# Patient Record
Sex: Female | Born: 1940
Health system: Southern US, Community
[De-identification: ages and names within clinical notes are randomized; demographics above are authoritative.]

## PROBLEM LIST (undated history)

## (undated) DIAGNOSIS — E7800 Pure hypercholesterolemia, unspecified: Secondary | ICD-10-CM

## (undated) DIAGNOSIS — M8580 Other specified disorders of bone density and structure, unspecified site: Secondary | ICD-10-CM

## (undated) DIAGNOSIS — I4891 Unspecified atrial fibrillation: Secondary | ICD-10-CM

## (undated) DIAGNOSIS — E669 Obesity, unspecified: Secondary | ICD-10-CM

## (undated) DIAGNOSIS — I10 Essential (primary) hypertension: Secondary | ICD-10-CM

## (undated) DIAGNOSIS — E119 Type 2 diabetes mellitus without complications: Secondary | ICD-10-CM

## (undated) DIAGNOSIS — I482 Chronic atrial fibrillation, unspecified: Secondary | ICD-10-CM

## (undated) HISTORY — DX: Essential (primary) hypertension: I10

## (undated) HISTORY — DX: Obesity, unspecified: E66.9

## (undated) HISTORY — PX: PARTIAL HYSTERECTOMY: SHX80

## (undated) HISTORY — DX: Other specified disorders of bone density and structure, unspecified site: M85.80

## (undated) HISTORY — DX: Type 2 diabetes mellitus without complications: E11.9

## (undated) HISTORY — DX: Chronic atrial fibrillation, unspecified: I48.20

---

## 1999-02-12 ENCOUNTER — Encounter: Admission: RE | Admit: 1999-02-12 | Discharge: 1999-02-12 | Payer: Self-pay | Admitting: Emergency Medicine

## 1999-02-12 ENCOUNTER — Encounter: Payer: Self-pay | Admitting: Emergency Medicine

## 1999-05-07 ENCOUNTER — Encounter: Payer: Self-pay | Admitting: Emergency Medicine

## 1999-05-07 ENCOUNTER — Encounter: Admission: RE | Admit: 1999-05-07 | Discharge: 1999-05-07 | Payer: Self-pay | Admitting: Emergency Medicine

## 2000-02-24 ENCOUNTER — Encounter: Payer: Self-pay | Admitting: Emergency Medicine

## 2000-02-24 ENCOUNTER — Encounter: Admission: RE | Admit: 2000-02-24 | Discharge: 2000-02-24 | Payer: Self-pay | Admitting: Emergency Medicine

## 2001-12-05 ENCOUNTER — Encounter: Payer: Self-pay | Admitting: Emergency Medicine

## 2001-12-05 ENCOUNTER — Encounter: Admission: RE | Admit: 2001-12-05 | Discharge: 2001-12-05 | Payer: Self-pay | Admitting: Emergency Medicine

## 2003-01-10 ENCOUNTER — Encounter: Admission: RE | Admit: 2003-01-10 | Discharge: 2003-01-10 | Payer: Self-pay | Admitting: Emergency Medicine

## 2003-05-08 ENCOUNTER — Encounter: Admission: RE | Admit: 2003-05-08 | Discharge: 2003-05-08 | Payer: Self-pay | Admitting: Emergency Medicine

## 2004-01-22 ENCOUNTER — Encounter: Admission: RE | Admit: 2004-01-22 | Discharge: 2004-01-22 | Payer: Self-pay | Admitting: Emergency Medicine

## 2005-01-22 ENCOUNTER — Encounter: Admission: RE | Admit: 2005-01-22 | Discharge: 2005-01-22 | Payer: Self-pay | Admitting: Emergency Medicine

## 2005-02-23 ENCOUNTER — Ambulatory Visit: Payer: Self-pay | Admitting: Internal Medicine

## 2005-02-25 ENCOUNTER — Ambulatory Visit: Payer: Self-pay | Admitting: Internal Medicine

## 2006-01-24 ENCOUNTER — Encounter: Admission: RE | Admit: 2006-01-24 | Discharge: 2006-01-24 | Payer: Self-pay | Admitting: Emergency Medicine

## 2007-01-27 ENCOUNTER — Encounter: Admission: RE | Admit: 2007-01-27 | Discharge: 2007-01-27 | Payer: Self-pay | Admitting: Emergency Medicine

## 2007-06-20 ENCOUNTER — Encounter: Admission: RE | Admit: 2007-06-20 | Discharge: 2007-06-20 | Payer: Self-pay | Admitting: Emergency Medicine

## 2008-01-29 ENCOUNTER — Encounter: Admission: RE | Admit: 2008-01-29 | Discharge: 2008-01-29 | Payer: Self-pay | Admitting: Emergency Medicine

## 2008-04-06 ENCOUNTER — Emergency Department (HOSPITAL_COMMUNITY): Admission: EM | Admit: 2008-04-06 | Discharge: 2008-04-06 | Payer: Self-pay | Admitting: Emergency Medicine

## 2008-11-01 ENCOUNTER — Encounter: Payer: Self-pay | Admitting: Internal Medicine

## 2008-12-27 ENCOUNTER — Encounter (INDEPENDENT_AMBULATORY_CARE_PROVIDER_SITE_OTHER): Payer: Self-pay | Admitting: *Deleted

## 2009-01-22 ENCOUNTER — Encounter: Payer: Self-pay | Admitting: Internal Medicine

## 2009-01-29 ENCOUNTER — Encounter: Admission: RE | Admit: 2009-01-29 | Discharge: 2009-01-29 | Payer: Self-pay | Admitting: Internal Medicine

## 2009-02-03 ENCOUNTER — Ambulatory Visit: Payer: Self-pay | Admitting: Internal Medicine

## 2009-02-03 ENCOUNTER — Encounter (INDEPENDENT_AMBULATORY_CARE_PROVIDER_SITE_OTHER): Payer: Self-pay | Admitting: *Deleted

## 2009-02-03 DIAGNOSIS — R1319 Other dysphagia: Secondary | ICD-10-CM

## 2009-02-04 ENCOUNTER — Ambulatory Visit: Payer: Self-pay | Admitting: Internal Medicine

## 2009-02-16 ENCOUNTER — Emergency Department (HOSPITAL_COMMUNITY): Admission: EM | Admit: 2009-02-16 | Discharge: 2009-02-16 | Payer: Self-pay | Admitting: Family Medicine

## 2010-02-05 ENCOUNTER — Encounter
Admission: RE | Admit: 2010-02-05 | Discharge: 2010-02-05 | Payer: Self-pay | Source: Home / Self Care | Attending: Internal Medicine | Admitting: Internal Medicine

## 2010-03-22 ENCOUNTER — Encounter: Payer: Self-pay | Admitting: Emergency Medicine

## 2010-07-03 ENCOUNTER — Other Ambulatory Visit: Payer: Self-pay | Admitting: Internal Medicine

## 2010-07-03 DIAGNOSIS — Z1231 Encounter for screening mammogram for malignant neoplasm of breast: Secondary | ICD-10-CM

## 2010-10-16 IMAGING — CR DG CHEST 2V
2 series · 2 of 2 positions shown · non-contrast
Comparison: 05/08/2003

CLINICAL DATA: Cough

CHEST - 2 VIEW

[view not recorded (1 of 2)]
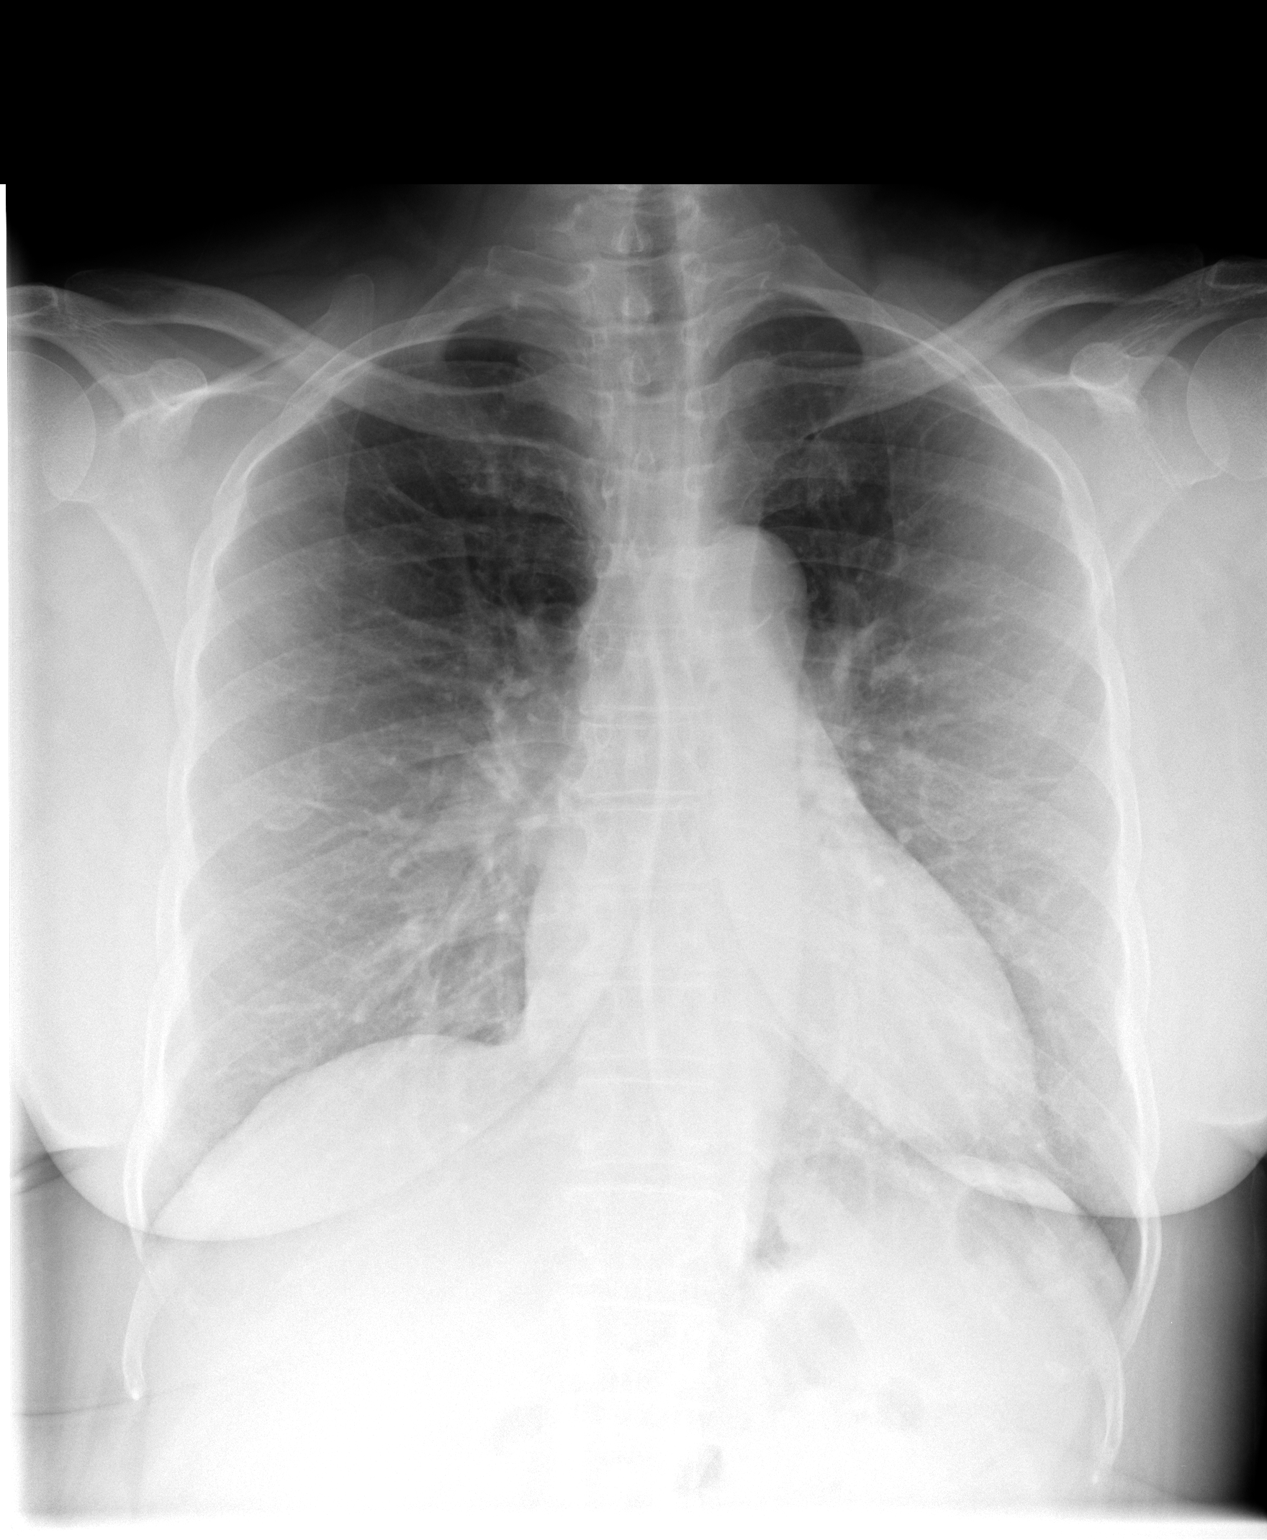

[view not recorded (2 of 2)]
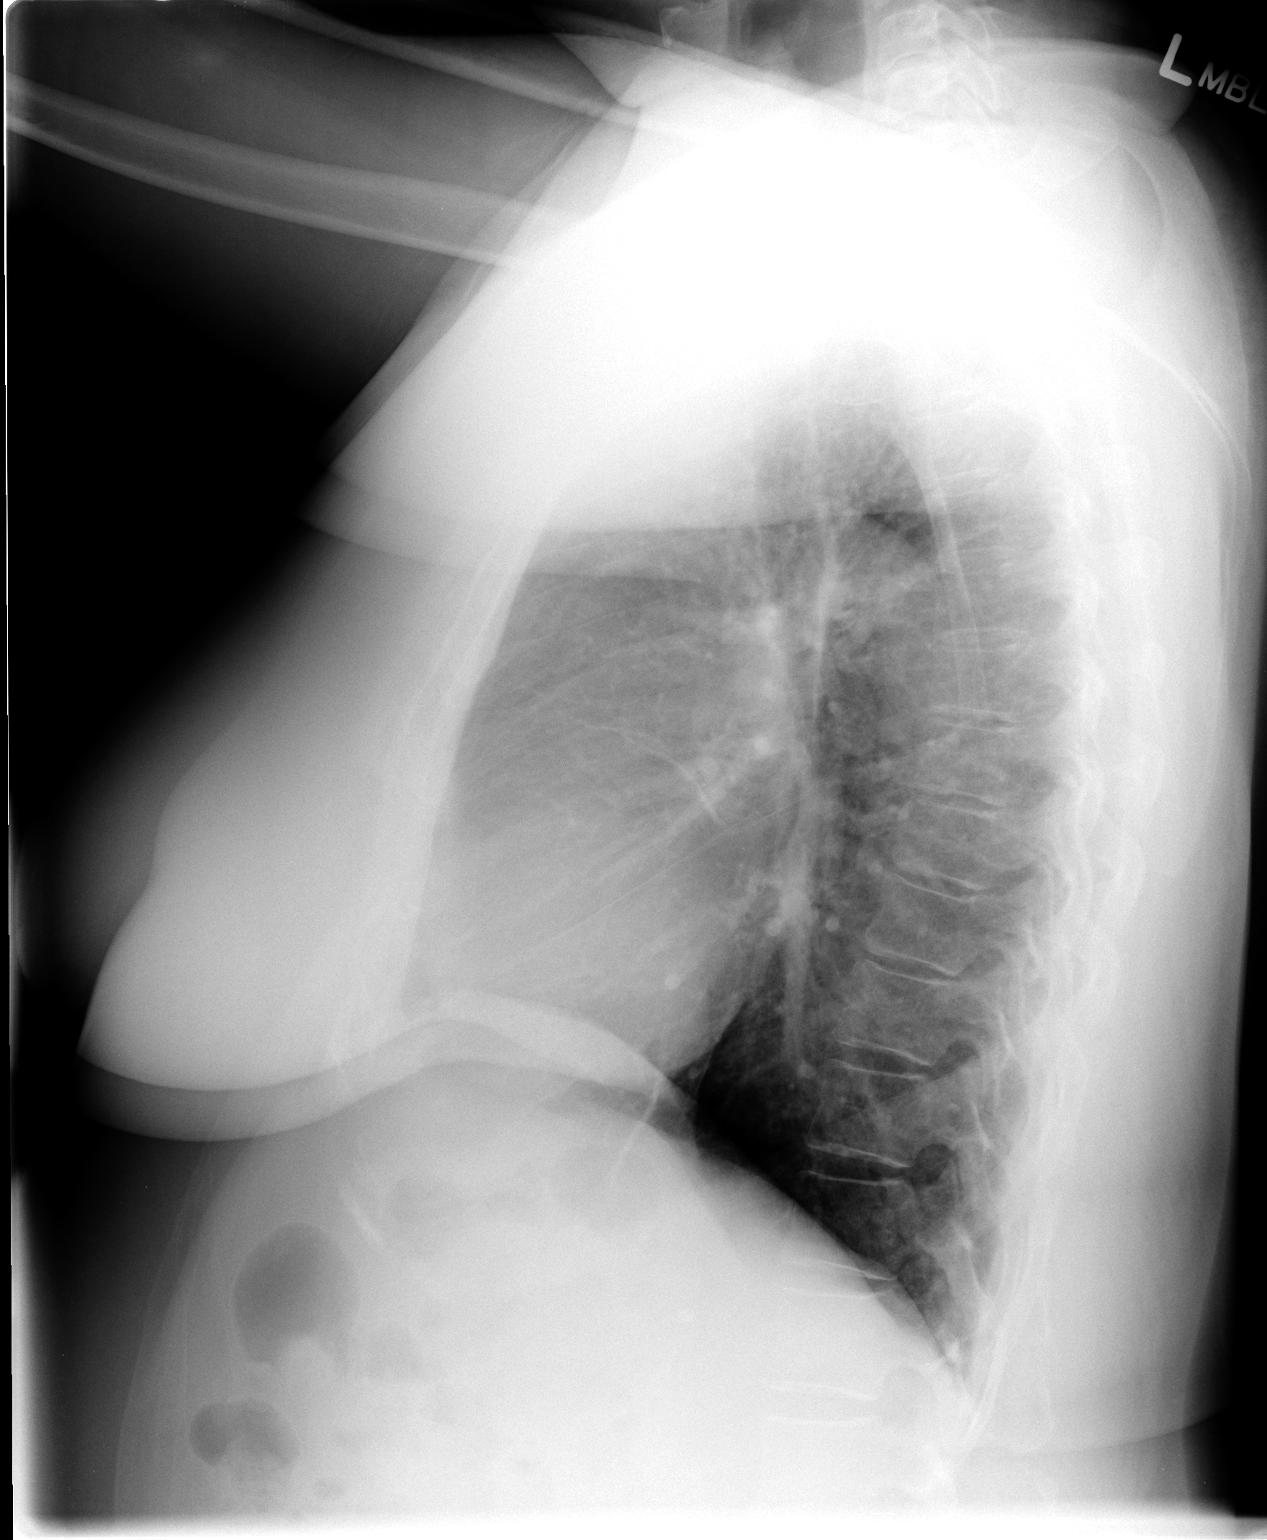

[2 of 2 positions shown; findings below may reference images not displayed]

FINDINGS: Mildly prominent diffuse interstitial markings as before
without confluent airspace infiltrate or overt edema.  No effusion.
Heart size upper limits normal.  Minimal atheromatous change noted
in the aortic arch.  Visualized bones unremarkable.
IMPRESSION: Mild chronic interstitial changes without acute or superimposed
abnormality.

## 2011-02-08 ENCOUNTER — Ambulatory Visit: Payer: Self-pay

## 2011-02-08 ENCOUNTER — Ambulatory Visit
Admission: RE | Admit: 2011-02-08 | Discharge: 2011-02-08 | Disposition: A | Discharge status: Home / Self Care | Payer: BC Managed Care – PPO | Source: Ambulatory Visit | Attending: Sports Medicine | Admitting: Sports Medicine

## 2011-02-08 DIAGNOSIS — Z1231 Encounter for screening mammogram for malignant neoplasm of breast: Secondary | ICD-10-CM

## 2011-09-06 ENCOUNTER — Encounter (HOSPITAL_COMMUNITY): Payer: Self-pay | Admitting: Emergency Medicine

## 2011-09-06 ENCOUNTER — Emergency Department (HOSPITAL_COMMUNITY)
Admission: EM | Admit: 2011-09-06 | Discharge: 2011-09-06 | Disposition: A | Discharge status: Home / Self Care | Payer: BC Managed Care – PPO | Attending: Emergency Medicine | Admitting: Emergency Medicine

## 2011-09-06 ENCOUNTER — Emergency Department (HOSPITAL_COMMUNITY): Payer: BC Managed Care – PPO

## 2011-09-06 DIAGNOSIS — S99919A Unspecified injury of unspecified ankle, initial encounter: Secondary | ICD-10-CM | POA: Insufficient documentation

## 2011-09-06 DIAGNOSIS — E7800 Pure hypercholesterolemia, unspecified: Secondary | ICD-10-CM | POA: Insufficient documentation

## 2011-09-06 DIAGNOSIS — I10 Essential (primary) hypertension: Secondary | ICD-10-CM | POA: Insufficient documentation

## 2011-09-06 DIAGNOSIS — W19XXXA Unspecified fall, initial encounter: Secondary | ICD-10-CM | POA: Insufficient documentation

## 2011-09-06 DIAGNOSIS — S8000XA Contusion of unspecified knee, initial encounter: Secondary | ICD-10-CM

## 2011-09-06 DIAGNOSIS — Y9289 Other specified places as the place of occurrence of the external cause: Secondary | ICD-10-CM | POA: Insufficient documentation

## 2011-09-06 DIAGNOSIS — S8990XA Unspecified injury of unspecified lower leg, initial encounter: Secondary | ICD-10-CM | POA: Insufficient documentation

## 2011-09-06 HISTORY — DX: Pure hypercholesterolemia, unspecified: E78.00

## 2011-09-06 NOTE — ED Notes (Signed)
Pt.stated, i forgot to step down in the parking lot and hit my lt. knee

## 2011-09-06 NOTE — Progress Notes (Signed)
Orthopedic Tech Progress Note Patient Details:  Jessica Baxter April 23, 1940 [id number]  Ortho Devices Type of Ortho Device: Knee Sleeve Ortho Device/Splint Interventions: Application   Jessica Baxter, Jessica Baxter 09/06/2011, 12:16 PM

## 2011-09-06 NOTE — ED Provider Notes (Signed)
History    This chart was scribed for Toy Baker, MD, MD by Smitty Pluck. The patient was seen in room TR07C and the patient's care was started at 11:23AM.   CSN: [id number]  Arrival date & time 09/06/11  0941   First MD Initiated Contact with Patient 09/06/11 1121      Chief Complaint  Patient presents with  . Knee Injury    (Consider location/radiation/quality/duration/timing/severity/associated sxs/prior treatment) The history is provided by the patient.   Jessica Baxter is a 71 y.o. female who presents to the Emergency Department complaining of fall onset today in parking lot causing moderate left knee pain. Pt denies taking blood thinners. Pt is ambulatory. Pt has mild right elbow pain. Denies LOC, head injury and neck pain. Denies shoulder pain and wrist pain. Symptoms have been constant since onset without radiation. Pain worse with walking  Past Medical History  Diagnosis Date  . Hypertension   . Hypercholesteremia     History reviewed. No pertinent past surgical history.  No family history on file.  History  Substance Use Topics  . Smoking status: Not on file  . Smokeless tobacco: Not on file  . Alcohol Use: No    OB History    Grav Para Term Preterm Abortions TAB SAB Ect Mult Living                  Review of Systems  Constitutional: Negative for fever and chills.  Respiratory: Negative for shortness of breath.   Gastrointestinal: Negative for nausea and vomiting.  Neurological: Negative for weakness.    Allergies  Review of patient's allergies indicates no known allergies.  Home Medications   Current Outpatient Rx  Name Route Sig Dispense Refill  . ASPIRIN EC 81 MG PO TBEC Oral Take 81 mg by mouth daily.    Marland Kitchen GARLIC PO TABS Oral Take 1 tablet by mouth daily.    Marland Kitchen GLUCOSAMINE PO Oral Take 1 tablet by mouth daily.    Marland Kitchen HYDROCHLOROTHIAZIDE 25 MG PO TABS Oral Take 25 mg by mouth daily.    Marland Kitchen LOSARTAN POTASSIUM 100 MG PO TABS Oral Take 100 mg by  mouth daily.    . RED YEAST RICE PO Oral Take 1 tablet by mouth daily.    Marland Kitchen VITAMIN D (CHOLECALCIFEROL) PO Oral Take 1 tablet by mouth daily.      BP 164/82  Pulse 57  Temp 98.3 F (36.8 C) (Oral)  Resp 18  SpO2 100%  Physical Exam  Nursing note and vitals reviewed. Constitutional: She is oriented to person, place, and time. She appears well-developed and well-nourished. No distress.  HENT:  Head: Normocephalic and atraumatic.  Eyes: EOM are normal. Pupils are equal, round, and reactive to light.  Neck: Normal range of motion. Neck supple. No tracheal deviation present.  Cardiovascular: Normal rate.   Pulmonary/Chest: Effort normal. No respiratory distress.  Abdominal: Soft. She exhibits no distension.  Musculoskeletal: Normal range of motion.       Right elbow rom nl no efusioin  Left knee has no patellar efusion but does have an  abrasion noted to patellar Right hand abrasions noted--no active bleeding  Neurological: She is alert and oriented to person, place, and time.  Skin: Skin is warm and dry.  Psychiatric: She has a normal mood and affect. Her behavior is normal.    ED Course  Procedures (including critical care time) DIAGNOSTIC STUDIES: Oxygen Saturation is 100% on room air, normal by my interpretation.  COORDINATION OF CARE:    Labs Reviewed - No data to display Dg Knee Complete 4 Views Left  09/06/2011  *RADIOLOGY REPORT*  Clinical Data: Fall.  Anterior pain and swelling.  LEFT KNEE - COMPLETE 4+ VIEW  Comparison: None.  Findings: Osteoarthritis noted with tri-compartmental spurring and medial compartmental narrowing.  No fracture observed.  Abnormal subcutaneous edema anterior to the patella and patellar tendon may reflect bruising.  No definite knee effusion, although sensitivity is reduced due to the degree of flexion during imaging.  IMPRESSION:  1.  Subcutaneous edema anterior to the patella and patellar tendon, probably representing bruising. 2.   Osteoarthritis.  Original Report Authenticated By: Dellia Cloud, M.D.     No diagnosis found.    MDM   Pt place in knee splint and will f/u her pcp as needed  I personally performed the services described in this documentation, which was scribed in my presence. The recorded information has been reviewed and considered.     Toy Baker, MD 09/06/11 1213

## 2011-09-06 NOTE — ED Notes (Signed)
Was bringing great niece to er and was walking thru parking lot and  Did not see step up and tripped and fell flat hurting left knee a, which now has swelling and scraped  Rt elbo

## 2011-12-31 ENCOUNTER — Other Ambulatory Visit: Payer: Self-pay | Admitting: Internal Medicine

## 2011-12-31 DIAGNOSIS — Z1231 Encounter for screening mammogram for malignant neoplasm of breast: Secondary | ICD-10-CM

## 2012-02-09 ENCOUNTER — Ambulatory Visit
Admission: RE | Admit: 2012-02-09 | Discharge: 2012-02-09 | Disposition: A | Discharge status: Home / Self Care | Payer: BC Managed Care – PPO | Source: Ambulatory Visit | Attending: Internal Medicine | Admitting: Internal Medicine

## 2012-02-09 DIAGNOSIS — Z1231 Encounter for screening mammogram for malignant neoplasm of breast: Secondary | ICD-10-CM

## 2013-01-11 ENCOUNTER — Other Ambulatory Visit: Payer: Self-pay

## 2013-01-11 DIAGNOSIS — Z1231 Encounter for screening mammogram for malignant neoplasm of breast: Secondary | ICD-10-CM

## 2013-02-09 ENCOUNTER — Ambulatory Visit
Admission: RE | Admit: 2013-02-09 | Discharge: 2013-02-09 | Disposition: A | Discharge status: Home / Self Care | Payer: BC Managed Care – PPO | Source: Ambulatory Visit

## 2013-02-09 DIAGNOSIS — Z1231 Encounter for screening mammogram for malignant neoplasm of breast: Secondary | ICD-10-CM

## 2013-04-25 ENCOUNTER — Encounter: Payer: Self-pay | Admitting: Cardiology

## 2013-04-25 ENCOUNTER — Ambulatory Visit (HOSPITAL_COMMUNITY): Payer: BC Managed Care – PPO | Attending: Cardiology | Admitting: Radiology

## 2013-04-25 DIAGNOSIS — I4891 Unspecified atrial fibrillation: Secondary | ICD-10-CM

## 2013-04-25 DIAGNOSIS — I059 Rheumatic mitral valve disease, unspecified: Secondary | ICD-10-CM | POA: Insufficient documentation

## 2013-04-25 DIAGNOSIS — R0602 Shortness of breath: Secondary | ICD-10-CM

## 2013-04-25 DIAGNOSIS — I10 Essential (primary) hypertension: Secondary | ICD-10-CM | POA: Insufficient documentation

## 2013-04-25 DIAGNOSIS — E119 Type 2 diabetes mellitus without complications: Secondary | ICD-10-CM | POA: Insufficient documentation

## 2013-04-25 NOTE — Addendum Note (Signed)
Addended by: Carmel SacramentoMCFATTER, Roger Fasnacht W on: 04/25/2013 12:58 PM   Modules accepted: Orders

## 2013-04-25 NOTE — Progress Notes (Signed)
Echocardiogram Performed. 

## 2013-05-16 ENCOUNTER — Encounter: Payer: Self-pay | Admitting: *Deleted

## 2013-05-16 ENCOUNTER — Ambulatory Visit (INDEPENDENT_AMBULATORY_CARE_PROVIDER_SITE_OTHER): Payer: BC Managed Care – PPO | Admitting: Cardiovascular Disease

## 2013-05-16 ENCOUNTER — Other Ambulatory Visit: Payer: Self-pay | Admitting: *Deleted

## 2013-05-16 ENCOUNTER — Encounter: Payer: Self-pay | Admitting: Cardiovascular Disease

## 2013-05-16 VITALS — BP 186/82 | HR 90 | Ht 62.0 in | Wt 193.8 lb

## 2013-05-16 DIAGNOSIS — I482 Chronic atrial fibrillation, unspecified: Secondary | ICD-10-CM | POA: Insufficient documentation

## 2013-05-16 DIAGNOSIS — R7301 Impaired fasting glucose: Secondary | ICD-10-CM | POA: Insufficient documentation

## 2013-05-16 DIAGNOSIS — I4891 Unspecified atrial fibrillation: Secondary | ICD-10-CM

## 2013-05-16 DIAGNOSIS — E663 Overweight: Secondary | ICD-10-CM | POA: Insufficient documentation

## 2013-05-16 DIAGNOSIS — E7800 Pure hypercholesterolemia, unspecified: Secondary | ICD-10-CM

## 2013-05-16 DIAGNOSIS — M8580 Other specified disorders of bone density and structure, unspecified site: Secondary | ICD-10-CM | POA: Insufficient documentation

## 2013-05-16 DIAGNOSIS — I10 Essential (primary) hypertension: Secondary | ICD-10-CM

## 2013-05-16 MED ORDER — APIXABAN 5 MG PO TABS: 5.0000 mg | ORAL_TABLET | Freq: Two times a day (BID) | ORAL | Status: DC

## 2013-05-16 NOTE — Assessment & Plan Note (Signed)
She prefers conservative approach.  [facility].  Start Eliquis  5 bid f/u clinic x 1   She prefers this to coumadin  Discussed risks of stroke vs bleeding

## 2013-05-16 NOTE — Assessment & Plan Note (Signed)
Well controlled.  Continue current medications and low sodium Dash type diet.   Avoid beta blockers 

## 2013-05-16 NOTE — Assessment & Plan Note (Signed)
Cholesterol is at goal.  Continue current dose of statin and diet Rx.  No myalgias or side effects.  F/U  LFT's in 6 months. No results found for this basename: [facility]  Labs with Dr Nehemiah SettlePolite  LFTls normal 2/15

## 2013-05-16 NOTE — Progress Notes (Signed)
Patient ID: Jessica Baxter, female   DOB: 02/23/1941, 73 y.o.   MRN: [id number]   73 yo referred by Dr Nehemiah SettlePolite for afib.  Relatively asymptomatic.  She has HTN, DM.  Meds adjusted recently.  Intolerant to beta blocker with fatigue and dyspnea.  Cr normal and no bleeding diathesis.  Not started on anticoagulation.  Still works on handicap bus.  CHADVASC Score is 4.  ( female age 74-73, DM and HTN)  She really denies SSCP, palpitations Occasional LE edema  Echo reviewed from 3/16 normal EF no valve disease and moderate LAE.  Long discussion with her regarding  Rate control , anticoagulation and cardioversion No history of TIA/CVA    04/13/13 Cr 1.03 K 3.7  PLT 271 Hct 35.6   ROS: Denies fever, malais, weight loss, blurry vision, decreased visual acuity, cough, sputum, SOB, hemoptysis, pleuritic pain, palpitaitons, heartburn, abdominal pain, melena, lower extremity edema, claudication, or rash.  All other systems reviewed and negative   General: Affect appropriate Healthy:  appears stated age HEENT: normal Neck supple with no adenopathy JVP normal no bruits no thyromegaly Lungs clear with no wheezing and good diaphragmatic motion Heart:  S1/S2 no murmur,rub, gallop or click PMI normal Abdomen: benighn, BS positve, no tenderness, no AAA no bruit.  No HSM or HJR Distal pulses intact with no bruits No edema Neuro non-focal Skin warm and dry No muscular weakness  Medications Current Outpatient Prescriptions  Medication Sig Dispense Refill  . ALPRAZolam (XANAX) 0.25 MG tablet Take 0.25 mg by mouth at bedtime as needed.       Marland Kitchen. amLODipine (NORVASC) 5 MG tablet Take 5 mg by mouth daily.       Marland Kitchen. aspirin EC 81 MG tablet Take 81 mg by mouth daily.      Marland Kitchen. CARDIZEM LA 120 MG 24 hr tablet Take 120 mg by mouth daily.       Marland Kitchen. COLCRYS 0.6 MG tablet Take 0.6 mg by mouth daily.       . furosemide (LASIX) 20 MG tablet Take 20 mg by mouth daily.       . Garlic TABS Take 1 tablet by mouth daily.      Marland Kitchen.  GLUCOSAMINE PO Take 1 tablet by mouth daily.      Marland Kitchen. KLOR-CON M10 10 MEQ tablet       . losartan (COZAAR) 100 MG tablet Take 100 mg by mouth daily.      . Red Yeast Rice Extract (RED YEAST RICE PO) Take 1 tablet by mouth daily.      . traMADol (ULTRAM) 50 MG tablet 1 tablet as needed for pain      . VITAMIN D, CHOLECALCIFEROL, PO Take 1 tablet by mouth daily.      Marland Kitchen. apixaban (ELIQUIS) 5 MG TABS tablet Take 1 tablet (5 mg total) by mouth 2 (two) times daily.  60 tablet  6   No current facility-administered medications for this visit.    Allergies Review of patient's allergies indicates no known allergies.  Family History: No family history on file.  Social History: History   Social History  . Marital Status: Single    Spouse Name: N/A    Number of Children: N/A  . Years of Education: N/A   Occupational History  . Not on file.   Social History Main Topics  . Smoking status: Never Smoker   . Smokeless tobacco: Not on file  . Alcohol Use: No  . Drug Use: No  . Sexual  Activity: Not on file   Other Topics Concern  . Not on file   Social History Narrative  . No narrative on file    Electrocardiogram:  Afib rate 82   04/27/13  Otherwise normal   Assessment and Plan

## 2013-05-16 NOTE — Patient Instructions (Signed)
Your physician recommends that you schedule a follow-up appointment in:    3 MONTHS WITH   DR Eden EmmsNISHAN AND  ALSO  COUMADIN CLINIC  NEXT AVAILABLE  INITIATING  ELIQUIS Your physician has recommended you make the following change in your medication:  START ELIQUIS  5 MG  TWICE  DAILY

## 2013-05-17 ENCOUNTER — Ambulatory Visit: Payer: BC Managed Care – PPO | Admitting: Pharmacist

## 2013-05-17 ENCOUNTER — Ambulatory Visit (INDEPENDENT_AMBULATORY_CARE_PROVIDER_SITE_OTHER): Payer: BC Managed Care – PPO | Admitting: Pharmacist

## 2013-05-17 DIAGNOSIS — I4891 Unspecified atrial fibrillation: Secondary | ICD-10-CM

## 2013-05-17 LAB — BASIC METABOLIC PANEL
BUN: 14 mg/dL (ref 6–23)
CHLORIDE: 104 meq/L (ref 96–112)
CO2: 27 mEq/L (ref 19–32)
Calcium: 9 mg/dL (ref 8.4–10.5)
Creatinine, Ser: 1 mg/dL (ref 0.4–1.2)
GFR: 66.67 mL/min (ref >60.00)
Glucose, Bld: 83 mg/dL (ref 70–99)
Potassium: 3.5 mEq/L (ref 3.5–5.1)
Sodium: 140 mEq/L (ref 135–145)

## 2013-05-17 LAB — CBC
HCT: 34.4 % — ABNORMAL LOW (ref 36.0–46.0)
Hemoglobin: 11.3 g/dL — ABNORMAL LOW (ref 12.0–15.0)
MCHC: 32.7 g/dL (ref 30.0–36.0)
MCV: 87.4 fl (ref 78.0–100.0)
Platelets: 255 10*3/uL (ref 150.0–400.0)
RBC: 3.93 Mil/uL (ref 3.87–5.11)
RDW: 13.3 % (ref 11.5–14.6)
WBC: 8.5 10*3/uL (ref 4.5–10.5)

## 2013-05-17 NOTE — Progress Notes (Signed)
Pt was started on Eliquis for atrial fibrillation on May 16, 2013.    Reviewed patients medication list.  Pt is not currently on any combined P-gp and strong CYP3A4 inhibitors/inducers (ketoconazole, traconazole, ritonavir, carbamazepine, phenytoin, rifampin, St. John's wort).  Reviewed labs.  SCr 1.0 , Weight 87.9 Kg, Age 73 yrs old.  Dose appropriate based on Criteria. Hgb and HCT 11.3/34.4. Pt is [facility] pt. And H&H are normally in this range per Dr Jimmye NormanSmart, Pharm D.   A full discussion of the nature of anticoagulants has been carried out.  A benefit/risk analysis has been presented to the patient, so that they understand the justification for choosing anticoagulation with Eliquis at this time.  The need for compliance is stressed.  Pt is aware to take the medication twice daily.  Side effects of potential bleeding are discussed, including unusual colored urine or stools, coughing up blood or coffee ground emesis, nose bleeds or serious fall or head trauma.  Discussed signs and symptoms of stroke. The patient should avoid any OTC items containing aspirin or ibuprofen.  Avoid alcohol consumption.   Call if any signs of abnormal bleeding.  Discussed financial obligations and resolved any difficulty in obtaining medication.  Next lab test test in 1 month. If labs are unchanged, will extend to 6 month follow-up  Reeve Turnley B. Artelia Larocheung, PharmD Clinical Pharmacist - Resident Phone: [phone] Pager: [phone] 05/17/2013 10:49 AM

## 2013-06-14 ENCOUNTER — Ambulatory Visit (INDEPENDENT_AMBULATORY_CARE_PROVIDER_SITE_OTHER): Payer: BC Managed Care – PPO | Admitting: *Deleted

## 2013-06-14 ENCOUNTER — Telehealth: Payer: Self-pay | Admitting: *Deleted

## 2013-06-14 DIAGNOSIS — E876 Hypokalemia: Secondary | ICD-10-CM

## 2013-06-14 DIAGNOSIS — I4891 Unspecified atrial fibrillation: Secondary | ICD-10-CM

## 2013-06-14 LAB — CBC
HCT: 34.1 % — ABNORMAL LOW (ref 36.0–46.0)
HEMOGLOBIN: 11.3 g/dL — AB (ref 12.0–15.0)
MCHC: 33 g/dL (ref 30.0–36.0)
MCV: 86.9 fl (ref 78.0–100.0)
Platelets: 248 10*3/uL (ref 150.0–400.0)
RBC: 3.92 Mil/uL (ref 3.87–5.11)
RDW: 13.8 % (ref 11.5–14.6)
WBC: 9 10*3/uL (ref 4.5–10.5)

## 2013-06-14 LAB — BASIC METABOLIC PANEL
BUN: 12 mg/dL (ref 6–23)
CO2: 27 meq/L (ref 19–32)
CREATININE: 1 mg/dL (ref 0.4–1.2)
Calcium: 9.1 mg/dL (ref 8.4–10.5)
Chloride: 103 mEq/L (ref 96–112)
GFR: 67.41 mL/min (ref >60.00)
GLUCOSE: 120 mg/dL — AB (ref 70–99)
Potassium: 3.1 mEq/L — ABNORMAL LOW (ref 3.5–5.1)
Sodium: 137 mEq/L (ref 135–145)

## 2013-06-14 NOTE — Telephone Encounter (Signed)
Increase daily dose of K to 20 as she is on lasix  F/u BMET in 4 weeks

## 2013-06-14 NOTE — Telephone Encounter (Signed)
Patient takes 10 mEq potassium daily.  Lab today is K+ 3.1.  Discussed with Dr. Ladona Ridgelaylor (DOD), rec for patient to take additional 30 mEq today, and follow up with her primary cardiologist, Dr. Eden EmmsNishan. Discussed this with patient who verbalizes understanding and agreement. Will route to Dr. Eden EmmsNishan for further review.

## 2013-06-14 NOTE — Progress Notes (Signed)
Pt was started on Eliquis for Afib on 05/16/13.    Reviewed patients medication list. Pt is not currently on any combined P-gp and strong CYP3A4 inhibitors/inducers (ketoconazole, traconazole, ritonavir, carbamazepine, phenytoin, rifampin, St. John's wort). Reviewed labs. SCr 1.0 , Weight 87.9 Kg, Age 73 yrs old. Dose appropriate based on Criteria. Hgb and HCT 11.3/34.1.    A full discussion of the nature of anticoagulants has been carried out.  A benefit/risk analysis has been presented to the patient, so that they understand the justification for choosing anticoagulation with Eliquis at this time.  The need for compliance is stressed.  Pt is aware to take the medication twice daily.  Side effects of potential bleeding are discussed, including unusual colored urine or stools, coughing up blood or coffee ground emesis, nose bleeds or serious fall or head trauma.  Discussed signs and symptoms of stroke. The patient should avoid any OTC items containing aspirin or ibuprofen.  Avoid alcohol consumption.   Call if any signs of abnormal bleeding.  Discussed financial obligations and resolved any difficulty in obtaining medication.  Next lab test test in 1 month per Dr Jimmye NormanSmart, Pharm D due to low H&H. Informed pt of this and she states her H&H are always low and she will not RTC in 1 mth she will RTC in 6mths. She states she sees Dr Nehemiah SettlePolite Q804mths and he is aware of her H&H.

## 2013-06-15 ENCOUNTER — Other Ambulatory Visit: Payer: Self-pay | Admitting: *Deleted

## 2013-06-15 MED ORDER — POTASSIUM CHLORIDE CRYS ER 20 MEQ PO TBCR: 20.0000 meq | EXTENDED_RELEASE_TABLET | Freq: Every day | ORAL | Status: DC

## 2013-06-15 NOTE — Telephone Encounter (Signed)
Informed patient.  New script sent to [facility] for 20 mEq tablets. She has an appointment on 5/26 with her PCP and lab work. Will mail her a script to include BMET with results to Dr. Eden EmmsNishan.

## 2013-08-09 ENCOUNTER — Telehealth: Payer: Self-pay | Admitting: Interventional Cardiology

## 2013-08-09 NOTE — Telephone Encounter (Signed)
I can't sign her anticoagulation visit from 3/15.

## 2013-08-22 ENCOUNTER — Ambulatory Visit (INDEPENDENT_AMBULATORY_CARE_PROVIDER_SITE_OTHER): Payer: BC Managed Care – PPO | Admitting: Cardiovascular Disease

## 2013-08-22 ENCOUNTER — Encounter: Payer: Self-pay | Admitting: Cardiovascular Disease

## 2013-08-22 VITALS — BP 138/68 | HR 71 | Ht 62.0 in | Wt 196.8 lb

## 2013-08-22 DIAGNOSIS — I10 Essential (primary) hypertension: Secondary | ICD-10-CM

## 2013-08-22 DIAGNOSIS — I482 Chronic atrial fibrillation, unspecified: Secondary | ICD-10-CM

## 2013-08-22 DIAGNOSIS — I4891 Unspecified atrial fibrillation: Secondary | ICD-10-CM

## 2013-08-22 DIAGNOSIS — E7800 Pure hypercholesterolemia, unspecified: Secondary | ICD-10-CM

## 2013-08-22 NOTE — Progress Notes (Signed)
Patient ID: Jessica Baxter, female   DOB: 08/13/1940, 73 y.o.   MRN: [id number] 73 yo referred by Dr Nehemiah SettlePolite for afib. Relatively asymptomatic. She has HTN, DM. Meds adjusted recently. Intolerant to beta blocker with fatigue and dyspnea. Cr normal and no bleeding diathesis. Not started on anticoagulation. Still works on handicap bus. CHADVASC Score is 4. ( female age 965-73, DM and HTN) She really denies SSCP, palpitations  Occasional LE edema Echo reviewed from 3/16 normal EF no valve disease and moderate LAE. Long discussion with her regarding Rate control , anticoagulation and cardioversion  No history of TIA/CVA   04/13/13 Cr 1.03 K 3.7 PLT 271 Hct 35.6   [facility]  No bleeding issues on Eliquis  ROS: Denies fever, malais, weight loss, blurry vision, decreased visual acuity, cough, sputum, SOB, hemoptysis, pleuritic pain, palpitaitons, heartburn, abdominal pain, melena, lower extremity edema, claudication, or rash.  All other systems reviewed and negative  General: Affect appropriate Healthy:  appears stated age HEENT: normal Neck supple with no adenopathy JVP normal no bruits no thyromegaly Lungs clear with no wheezing and good diaphragmatic motion Heart:  S1/S2 no murmur, no rub, gallop or click PMI normal Abdomen: benighn, BS positve, no tenderness, no AAA no bruit.  No HSM or HJR Distal pulses intact with no bruits Plus one bilateral  edema Neuro non-focal Skin warm and dry No muscular weakness   Current Outpatient Prescriptions  Medication Sig Dispense Refill  . ALPRAZolam (XANAX) 0.25 MG tablet Take 0.25 mg by mouth at bedtime as needed.       Marland Kitchen. apixaban (ELIQUIS) 5 MG TABS tablet Take 1 tablet (5 mg total) by mouth 2 (two) times daily.  60 tablet  6  . aspirin EC 81 MG tablet Take 81 mg by mouth daily.      Marland Kitchen. CARDIZEM LA 120 MG 24 hr tablet Take 120 mg by mouth daily.       Marland Kitchen. COLCRYS 0.6 MG tablet Take 0.6 mg by mouth as needed.       . furosemide  (LASIX) 20 MG tablet Take 20 mg by mouth daily.       . Garlic TABS Take 1 tablet by mouth daily.      Marland Kitchen. GLUCOSAMINE PO Take 1 tablet by mouth daily.      Marland Kitchen. losartan (COZAAR) 100 MG tablet Take 100 mg by mouth daily.      . potassium chloride SA (KLOR-CON M10) 20 MEQ tablet Take 1 tablet (20 mEq total) by mouth daily.  90 tablet  3  . Red Yeast Rice Extract (RED YEAST RICE PO) Take 1 tablet by mouth daily.      . traMADol (ULTRAM) 50 MG tablet 1 tablet as needed for pain      . VITAMIN D, CHOLECALCIFEROL, PO Take 1 tablet by mouth daily.       No current facility-administered medications for this visit.    Allergies  Review of patient's allergies indicates no known allergies.  Electrocardiogram:  10/27 afib nonspecific ST changes   Assessment and Plan

## 2013-08-22 NOTE — Patient Instructions (Signed)
Your physician wants you to follow-up in:  6 MONTHS WITH DR NISHAN  You will receive a reminder letter in the mail two months in advance. If you don't receive a letter, please call our office to schedule the follow-up appointment. Your physician recommends that you continue on your current medications as directed. Please refer to the Current Medication list given to you today. 

## 2013-08-22 NOTE — Assessment & Plan Note (Signed)
Well controlled.  Continue current medications and low sodium Dash type diet.    

## 2013-08-22 NOTE — Assessment & Plan Note (Signed)
Cholesterol is at goal.  Continue current dose of statin and diet Rx.  No myalgias or side effects.  F/U  LFT's in 6 months. No results found for this basename: LDLCALC   Labs with Dr Polite          

## 2013-08-22 NOTE — Assessment & Plan Note (Signed)
Good rate control Tolerating Eliquis with no bleeding issues

## 2013-08-30 ENCOUNTER — Other Ambulatory Visit: Payer: Self-pay | Admitting: Internal Medicine

## 2013-08-30 DIAGNOSIS — R109 Unspecified abdominal pain: Secondary | ICD-10-CM

## 2013-09-06 ENCOUNTER — Ambulatory Visit
Admission: RE | Admit: 2013-09-06 | Discharge: 2013-09-06 | Disposition: A | Discharge status: Home / Self Care | Payer: BC Managed Care – PPO | Source: Ambulatory Visit | Attending: Internal Medicine | Admitting: Internal Medicine

## 2013-09-06 DIAGNOSIS — R109 Unspecified abdominal pain: Secondary | ICD-10-CM

## 2013-09-06 MED ORDER — IOHEXOL 300 MG/ML  SOLN
100.0000 mL | Freq: Once | INTRAMUSCULAR | Status: AC | PRN
  Administered 2013-09-06: 100 mL via INTRAVENOUS

## 2013-12-13 ENCOUNTER — Other Ambulatory Visit: Payer: Self-pay | Admitting: Internal Medicine

## 2013-12-13 DIAGNOSIS — M8580 Other specified disorders of bone density and structure, unspecified site: Secondary | ICD-10-CM

## 2013-12-13 DIAGNOSIS — Z1239 Encounter for other screening for malignant neoplasm of breast: Secondary | ICD-10-CM

## 2014-01-07 ENCOUNTER — Ambulatory Visit (INDEPENDENT_AMBULATORY_CARE_PROVIDER_SITE_OTHER): Payer: BC Managed Care – PPO | Admitting: *Deleted

## 2014-01-07 DIAGNOSIS — I4891 Unspecified atrial fibrillation: Secondary | ICD-10-CM

## 2014-01-07 LAB — CBC
HEMATOCRIT: 35.2 % — AB (ref 36.0–46.0)
HEMOGLOBIN: 11.2 g/dL — AB (ref 12.0–15.0)
MCHC: 31.9 g/dL (ref 30.0–36.0)
MCV: 86.3 fl (ref 78.0–100.0)
PLATELETS: 252 10*3/uL (ref 150.0–400.0)
RBC: 4.08 Mil/uL (ref 3.87–5.11)
RDW: 13.9 % (ref 11.5–15.5)
WBC: 7.8 10*3/uL (ref 4.0–10.5)

## 2014-01-07 LAB — BASIC METABOLIC PANEL
BUN: 17 mg/dL (ref 6–23)
CALCIUM: 9.1 mg/dL (ref 8.4–10.5)
CO2: 25 mEq/L (ref 19–32)
Chloride: 104 mEq/L (ref 96–112)
Creatinine, Ser: 1.2 mg/dL (ref 0.4–1.2)
GFR: 54.84 mL/min — AB (ref >60.00)
GLUCOSE: 140 mg/dL — AB (ref 70–99)
Potassium: 3.6 mEq/L (ref 3.5–5.1)
Sodium: 138 mEq/L (ref 135–145)

## 2014-01-07 NOTE — Progress Notes (Signed)
Pt was started on Eliquis 5mg  bid for Atrial Fib on March 18.2015.    Reviewed patients medication list.  Pt is not currently on any combined P-gp and strong CYP3A4 inhibitors/inducers (ketoconazole, traconazole, ritonavir, carbamazepine, phenytoin, rifampin, St. John's wort).  Reviewed labs.  SCr 1.2, Weight 88.81kg,.  Dose is appropriate based on labs weight and age.  Hgb 11.2 and HCT 35.2  A full discussion of the nature of anticoagulants has been carried out.  A benefit/risk analysis has been presented to the patient, so that they understand the justification for choosing anticoagulation with Eliquis at this time.  The need for compliance is stressed.  Pt is aware to take the medication twice daily.  Side effects of potential bleeding are discussed, including unusual colored urine or stools, coughing up blood or coffee ground emesis, nose bleeds or serious fall or head trauma.  Discussed signs and symptoms of stroke. The patient should avoid any OTC items containing aspirin or ibuprofen.  Avoid alcohol consumption.   Call if any signs of abnormal bleeding.  Discussed financial obligations and pt states is able to afford at present time.  Next lab test test in 6 months.  Pt states is having no problem in obtaining Eliquis and has had no sign or symptom of bleeding or sign or symptom of stroke and has not missed any doses Reviewed above and pt states understanding and will call  later with results of labs 01/08/2014 Spoke with pt and instructed to continue on same dose of Eliquis 5mg  bid and made appt for her to be seen in  6 months Also instructed pt that had sent message to Dr Eden EmmsNishan regarding her being on ASA and Eliquis  and will call back if any changes but for now take as directed and she states understanding

## 2014-01-09 ENCOUNTER — Telehealth: Payer: Self-pay | Admitting: *Deleted

## 2014-01-09 NOTE — Telephone Encounter (Signed)
-----   Message from Wendall StadePeter C Nishan, MD sent at 01/08/2014  5:53 PM EST ----- She has no CAD so can stop ASA  ----- Message -----    From: Jeannine KittenSharon G Walker, RN    Sent: 01/08/2014   8:59 AM      To: Wendall StadePeter C Nishan, MD, Alois Clichehristine E York, LPN  Dr Ricka BurdockNishan  Saw Mrs [place] for 6 months check regarding Eliquis and she is on ASA . Just wanted to know if she is to stay on ASA  Please advise Thank you  Lelon PerlaSharon Walker RN

## 2014-01-09 NOTE — Telephone Encounter (Signed)
Spoke with pt and instructed her that she may stop her Aspirin  per order from Dr Eden EmmsNishan and she states understanding. Also instructed to have Dr Nehemiah SettlePolite follow her  blood sugar and she states understanding.

## 2014-01-22 NOTE — Progress Notes (Signed)
This is not anyone I know. Seems to be Dr. Eden EmmsNishan.

## 2014-01-30 ENCOUNTER — Other Ambulatory Visit: Payer: Self-pay | Admitting: Cardiovascular Disease

## 2014-02-11 ENCOUNTER — Ambulatory Visit
Admission: RE | Admit: 2014-02-11 | Discharge: 2014-02-11 | Disposition: A | Discharge status: Home / Self Care | Payer: BC Managed Care – PPO | Source: Ambulatory Visit | Attending: Internal Medicine | Admitting: Internal Medicine

## 2014-02-11 DIAGNOSIS — Z1239 Encounter for other screening for malignant neoplasm of breast: Secondary | ICD-10-CM

## 2014-02-11 DIAGNOSIS — M8580 Other specified disorders of bone density and structure, unspecified site: Secondary | ICD-10-CM

## 2014-02-21 NOTE — Progress Notes (Signed)
Patient ID: Jessica Baxter, female   DOB: 08/02/1940, 73 y.o.   MRN: [id number] 73 yo referred by Dr Nehemiah SettlePolite for afib. Relatively asymptomatic. She has HTN, DM. Meds adjusted recently. Intolerant to beta blocker with fatigue and dyspnea. Cr normal and no bleeding diathesis.  Still works on handicap bus. CHADVASC Score is 4. ( female age 74-73, DM and HTN) She really denies SSCP, palpitations  Occasional LE edema Echo reviewed from 3/16 normal EF no valve disease and moderate LAE. Long discussion with her regarding Rate control , anticoagulation and cardioversion  No history of TIA/CVA   04/13/13 Cr 1.03 K 3.7 PLT 271 Hct 35.6   [facility]. No bleeding issues on Eliquis    ROS: Denies fever, malais, weight loss, blurry vision, decreased visual acuity, cough, sputum, SOB, hemoptysis, pleuritic pain, palpitaitons, heartburn, abdominal pain, melena, lower extremity edema, claudication, or rash.  All other systems reviewed and negative  General: Affect appropriate Healthy:  appears stated age HEENT: normal Neck supple with no adenopathy JVP normal no bruits no thyromegaly Lungs clear with no wheezing and good diaphragmatic motion Heart:  S1/S2 no murmur, no rub, gallop or click PMI normal Abdomen: benighn, BS positve, no tenderness, no AAA no bruit.  No HSM or HJR Distal pulses intact with no bruits Trace bilateral  edema Neuro non-focal Skin warm and dry No muscular weakness   Current Outpatient Prescriptions  Medication Sig Dispense Refill  . ALPRAZolam (XANAX) 0.25 MG tablet Take 0.25 mg by mouth at bedtime as needed.     Marland Kitchen. CARDIZEM LA 120 MG 24 hr tablet Take 120 mg by mouth daily.     Marland Kitchen. COLCRYS 0.6 MG tablet Take 0.6 mg by mouth as needed.     Marland Kitchen. ELIQUIS 5 MG TABS tablet TAKE 1 TABLET BY MOUTH TWICE A DAY 60 tablet 1  . furosemide (LASIX) 20 MG tablet Take 20 mg by mouth daily.     . Garlic TABS Take 1 tablet by mouth daily.    Marland Kitchen. GLUCOSAMINE PO Take 1 tablet  by mouth daily.    Marland Kitchen. losartan (COZAAR) 100 MG tablet Take 100 mg by mouth daily.    . potassium chloride SA (KLOR-CON M10) 20 MEQ tablet Take 1 tablet (20 mEq total) by mouth daily. 90 tablet 3  . Red Yeast Rice Extract (RED YEAST RICE PO) Take 1 tablet by mouth daily.    . traMADol (ULTRAM) 50 MG tablet 1 tablet as needed for pain    . VITAMIN D, CHOLECALCIFEROL, PO Take 1 tablet by mouth daily.     No current facility-administered medications for this visit.    Allergies  Review of patient's allergies indicates no known allergies.  Electrocardiogram:  12/25/12  10/27 afib nonspecific ST changes  Today afib rate 80 normal    Assessment and Plan

## 2014-02-25 ENCOUNTER — Encounter: Payer: Self-pay | Admitting: Cardiovascular Disease

## 2014-02-25 ENCOUNTER — Ambulatory Visit (INDEPENDENT_AMBULATORY_CARE_PROVIDER_SITE_OTHER): Payer: BC Managed Care – PPO | Admitting: Cardiovascular Disease

## 2014-02-25 VITALS — BP 126/74 | HR 80 | Ht 62.0 in | Wt 192.0 lb

## 2014-02-25 DIAGNOSIS — I10 Essential (primary) hypertension: Secondary | ICD-10-CM

## 2014-02-25 DIAGNOSIS — E7800 Pure hypercholesterolemia, unspecified: Secondary | ICD-10-CM

## 2014-02-25 DIAGNOSIS — I482 Chronic atrial fibrillation, unspecified: Secondary | ICD-10-CM

## 2014-02-25 DIAGNOSIS — R7301 Impaired fasting glucose: Secondary | ICD-10-CM

## 2014-02-25 NOTE — Assessment & Plan Note (Signed)
Cholesterol is at goal.  Continue current dose of statin and diet Rx.  No myalgias or side effects.  F/U  LFT's in 6 months. No results found for: LDLCALC           

## 2014-02-25 NOTE — Patient Instructions (Signed)
Your physician wants you to follow-up in: 12 months with Dr. Eden EmmsNishan. You will receive a reminder letter in the mail two months in advance. If you don't receive a letter, please call our office to schedule the follow-up appointment.  Your physician recommends that you continue on your current medications as directed. Please refer to the Current Medication list given to you today.

## 2014-02-25 NOTE — Assessment & Plan Note (Signed)
Well controlled.  Continue current medications and low sodium Dash type diet.    

## 2014-02-25 NOTE — Assessment & Plan Note (Signed)
Good rate control and anticoagulation  No bleeding issues  

## 2014-02-25 NOTE — Assessment & Plan Note (Signed)
Discussed low carb diet.  Target hemoglobin A1c is 6.5 or less.  Continue current medications.  

## 2014-02-26 ENCOUNTER — Telehealth: Payer: Self-pay | Admitting: Cardiovascular Disease

## 2014-02-26 NOTE — Telephone Encounter (Signed)
WILL FORWARD TO DR NISHAN FOR  REVIEW./CY 

## 2014-02-26 NOTE — Telephone Encounter (Signed)
PT  NOTIFIED ./CY 

## 2014-02-26 NOTE — Telephone Encounter (Signed)
New message      Pt joined [employer].  Is it ok to use the machines at the fitness center?

## 2014-02-26 NOTE — Telephone Encounter (Signed)
Yes can use machines just don't drop anything heavy on herself she is on blood thinner

## 2014-04-09 ENCOUNTER — Other Ambulatory Visit: Payer: Self-pay | Admitting: Cardiovascular Disease

## 2014-07-09 ENCOUNTER — Ambulatory Visit (INDEPENDENT_AMBULATORY_CARE_PROVIDER_SITE_OTHER): Payer: BC Managed Care – PPO | Admitting: *Deleted

## 2014-07-09 DIAGNOSIS — I4891 Unspecified atrial fibrillation: Secondary | ICD-10-CM

## 2014-07-09 LAB — CBC
HEMATOCRIT: 35 % — AB (ref 36.0–46.0)
Hemoglobin: 11.7 g/dL — ABNORMAL LOW (ref 12.0–15.0)
MCHC: 33.4 g/dL (ref 30.0–36.0)
MCV: 84.2 fl (ref 78.0–100.0)
PLATELETS: 230 10*3/uL (ref 150.0–400.0)
RBC: 4.16 Mil/uL (ref 3.87–5.11)
RDW: 14 % (ref 11.5–15.5)
WBC: 8.3 10*3/uL (ref 4.0–10.5)

## 2014-07-09 LAB — BASIC METABOLIC PANEL
BUN: 16 mg/dL (ref 6–23)
CALCIUM: 9.4 mg/dL (ref 8.4–10.5)
CO2: 27 mEq/L (ref 19–32)
Chloride: 103 mEq/L (ref 96–112)
Creatinine, Ser: 1.04 mg/dL (ref 0.40–1.20)
GFR: 66.46 mL/min (ref >60.00)
GLUCOSE: 86 mg/dL (ref 70–99)
POTASSIUM: 3.4 meq/L — AB (ref 3.5–5.1)
SODIUM: 137 meq/L (ref 135–145)

## 2014-07-09 NOTE — Progress Notes (Signed)
Pt was started on Eliquis 5mg  twice a day for AFIB on May 16, 2013 by Dr. Eden EmmsNishan.    Reviewed patients medication list.  Pt is not  currently on any combined P-gp and strong CYP3A4 inhibitors/inducers (ketoconazole, traconazole, ritonavir, carbamazepine, phenytoin, rifampin, St. John's wort).  Reviewed labs.  SCr 1.04, Hgb 11.7, HCT 35.0, Weight 89.1082kg, Age 74 years old.  Dose is appropriate based on dosing criteria.  A full discussion of the nature of anticoagulants has been carried out.  A benefit/risk analysis has been presented to the patient, so that they understand the justification for choosing anticoagulation with Eliquis at this time.  The need for compliance is stressed.  Pt is aware to take the medication twice daily.  Side effects of potential bleeding are discussed, including unusual colored urine or stools, coughing up blood or coffee ground emesis, nose bleeds or serious fall or head trauma.  Discussed signs and symptoms of stroke. The patient should avoid any OTC items containing aspirin or ibuprofen.  Avoid alcohol consumption.   Call if any signs of abnormal bleeding.  Discussed financial obligations and resolved any difficulty in obtaining medication.    Called patient to inform her of labs results.  Advised to continue taking Eliquis 5 mg twice a day  and to contact Dr. Eden EmmsNishan or [facility] with any problems, she verbalized understanding.

## 2014-08-14 ENCOUNTER — Other Ambulatory Visit: Payer: Self-pay | Admitting: Cardiovascular Disease

## 2014-09-24 ENCOUNTER — Other Ambulatory Visit: Payer: Self-pay | Admitting: Cardiovascular Disease

## 2014-10-15 ENCOUNTER — Encounter: Payer: Self-pay | Admitting: Internal Medicine

## 2014-11-21 ENCOUNTER — Encounter: Payer: Self-pay | Admitting: Podiatry

## 2014-11-21 ENCOUNTER — Ambulatory Visit (INDEPENDENT_AMBULATORY_CARE_PROVIDER_SITE_OTHER): Payer: BC Managed Care – PPO | Admitting: Podiatry

## 2014-11-21 ENCOUNTER — Ambulatory Visit (INDEPENDENT_AMBULATORY_CARE_PROVIDER_SITE_OTHER): Payer: BC Managed Care – PPO

## 2014-11-21 DIAGNOSIS — M779 Enthesopathy, unspecified: Secondary | ICD-10-CM

## 2014-11-21 DIAGNOSIS — M7662 Achilles tendinitis, left leg: Secondary | ICD-10-CM | POA: Diagnosis not present

## 2014-11-21 NOTE — Progress Notes (Signed)
   Subjective:    Patient ID: Jessica Baxter, female    DOB: 1940/07/14, 74 y.o.   MRN: [id number]  HPI: 74 year old female presents today with a chief complaint of pain 3 months left heel. She states that she had an injury stepping off a curb approximately a year ago she states that the foot hurt for about 3 days then got better. She states that she is currently taking Tylenol for her pain but this will not subside. She states that it stays swollen and hot and is hard for her to do her job. She states that her primary care provider has put her out of work for the next few days.    Review of Systems  Cardiovascular: Positive for leg swelling.  Gastrointestinal: Positive for constipation.  Musculoskeletal: Positive for back pain.  Hematological: Bruises/bleeds easily.  All other systems reviewed and are negative.      Objective:   Physical Exam: 74 year old female no apparent distress. Good-natured. Vital signs are stable alert and oriented 3. Pulses are strongly palpable neurologic sensorium is intact per Semmes-Weinstein monofilament. Deep tendon reflexes are intact bilateral muscle strength +5 over 5 dorsiflexion plantar flexors and inverters everters on his musculature is intact. Orthopedic evaluation shows all joints distal to the ankle range of motion without crepitation. Large nonpulsatile mass to the posterior aspect of the left ankle is warm to the touch mild erythema she has some tenderness on palpation of the Achilles tendon as it inserts on the posterior aspect and posterior superior aspect of the calcaneus. Radiographs demonstrate large posterior calcaneal heel spur with soft tissue increase in density and thickening of the Achilles tendon. Cutaneous evaluationsupple well-hydrated cutis no open lesions.        Assessment & Plan:  Retrocalcaneal heel spur with insertional Achilles tendinitis left heel.  Plan: Discussed etiology pathology conservative or surgical therapies. At  this point injected the area with 2 mg of dexamethasone and local anesthesia. We did not inject into the tendon. Place her in a short cam boot discussed ice therapy and will follow up with her in 1 month.

## 2014-11-21 NOTE — Patient Instructions (Signed)

## 2014-11-27 ENCOUNTER — Ambulatory Visit: Payer: BC Managed Care – PPO | Admitting: Podiatry

## 2014-11-29 ENCOUNTER — Telehealth: Payer: Self-pay | Admitting: *Deleted

## 2014-11-29 NOTE — Telephone Encounter (Signed)
Pt asked if she needed to stay in the boot until her appt.  I told pt to decrease the chance of worsening or tearing the achilles tendon she need to stay in the boot.  Pt states understanding.

## 2014-12-12 ENCOUNTER — Telehealth: Payer: Self-pay | Admitting: Cardiovascular Disease

## 2014-12-12 NOTE — Telephone Encounter (Signed)
New Message  Pt wanting to know if she can have blood work done at Dr [facility]- [place]. Please call back and discuss.

## 2014-12-12 NOTE — Telephone Encounter (Addendum)
Attempted to call home number, phone rang several times with no answer and no voicemail. Attempted cell number and network error message came on. Will try again later. Pt also needs appt with Dr. Eden EmmsNishan 01/2015 per recall.

## 2014-12-13 NOTE — Telephone Encounter (Signed)
Spoke with patient who states she has an appointment with Dr. Nehemiah SettlePolite next month and states he always gets lab work.  I advised her to ask them to send copies to our office or give her copies to bring to her next appointment.  She states she needs to schedule an appointment with Dr. Eden EmmsNishan between 12/22 and 1/2.  I advised her that he is not in the office any of these days.  I scheduled her to see Tereso NewcomerScott Weaver, PA on 12/29.  She verbalized understanding and agreement.

## 2014-12-19 ENCOUNTER — Encounter: Payer: Self-pay | Admitting: Podiatry

## 2014-12-19 ENCOUNTER — Ambulatory Visit (INDEPENDENT_AMBULATORY_CARE_PROVIDER_SITE_OTHER): Payer: BC Managed Care – PPO | Admitting: Podiatry

## 2014-12-19 VITALS — BP 148/72 | HR 81 | Resp 16

## 2014-12-19 DIAGNOSIS — M7662 Achilles tendinitis, left leg: Secondary | ICD-10-CM | POA: Diagnosis not present

## 2014-12-19 NOTE — Progress Notes (Signed)
She presents today for follow-up of her Achilles tendinitis. She states that it feels better Jessica Baxter been in the boot though it is somewhat tender as she wears the boot.  Objective: Vital signs are stable she is alert and oriented 3. Pulses are palpable. He has tenderness on palpation at the insertion site of the Achilles on the posterior aspect of the left calcaneus. There is mild erythema mild edema no cellulitis drainage or odor.  Assessment: Chronic Achilles tendinitis left.  Plan: I encouraged her to continue the use of the Cam Walker for one more month. If she is not improved considerably in 1 month we will request an MRI.  Arbutus Pedodd Hyatt DPM

## 2014-12-19 NOTE — Patient Instructions (Signed)

## 2014-12-30 ENCOUNTER — Other Ambulatory Visit: Payer: Self-pay

## 2014-12-30 ENCOUNTER — Other Ambulatory Visit: Payer: Self-pay | Admitting: Cardiovascular Disease

## 2014-12-30 DIAGNOSIS — Z1231 Encounter for screening mammogram for malignant neoplasm of breast: Secondary | ICD-10-CM

## 2015-01-07 ENCOUNTER — Ambulatory Visit (INDEPENDENT_AMBULATORY_CARE_PROVIDER_SITE_OTHER): Payer: BC Managed Care – PPO | Admitting: Podiatry

## 2015-01-07 ENCOUNTER — Encounter: Payer: Self-pay | Admitting: Podiatry

## 2015-01-07 VITALS — BP 153/68 | HR 102 | Resp 16

## 2015-01-07 DIAGNOSIS — M7662 Achilles tendinitis, left leg: Secondary | ICD-10-CM

## 2015-01-07 NOTE — Progress Notes (Signed)
She presents today for follow-up of her Achilles tendinitis or left heel after having worn the cam walker for the past several weeks. She states that there is no pain.  Objective: Vital signs demonstrate tachycardia with mild hypertension today. I recommended that she follow-up with her PCP regarding this. She has no reproducible pain on palpation to the left Achilles today. No open wounds no lesions much decrease in edema no erythema or warmth on palpation.  Assessment: Resolving insertional Achilles tendinitis left.  Plan: I will allow her to get back into her regular shoe gear and I will follow-up with her in 1 month if necessary.

## 2015-02-04 ENCOUNTER — Encounter: Payer: Self-pay | Admitting: Podiatry

## 2015-02-04 ENCOUNTER — Ambulatory Visit (INDEPENDENT_AMBULATORY_CARE_PROVIDER_SITE_OTHER): Payer: BC Managed Care – PPO | Admitting: Podiatry

## 2015-02-04 VITALS — BP 108/85 | HR 64 | Resp 16

## 2015-02-04 DIAGNOSIS — M7662 Achilles tendinitis, left leg: Secondary | ICD-10-CM

## 2015-02-04 NOTE — Progress Notes (Signed)
She presents for follow-up of Achilles tendinitis of her left foot. She states this seems to be doing a lot better and she is very happy with the outcome thus far.  Objective: Vital signs are stable she is alert and oriented 3. Pulses are palpable mild tenderness on palpation of the Achilles insertion site of the left heel.  Assessment: Pain and limb secondary to Achilles tendinitis left.  Plan: I reinjected today with dexamethasone and local anesthetic will follow up with her in 2 months.

## 2015-02-13 ENCOUNTER — Ambulatory Visit
Admission: RE | Admit: 2015-02-13 | Discharge: 2015-02-13 | Disposition: A | Discharge status: Home / Self Care | Payer: BC Managed Care – PPO | Source: Ambulatory Visit

## 2015-02-13 DIAGNOSIS — Z1231 Encounter for screening mammogram for malignant neoplasm of breast: Secondary | ICD-10-CM

## 2015-02-17 ENCOUNTER — Encounter: Payer: Self-pay | Admitting: Cardiovascular Disease

## 2015-02-19 ENCOUNTER — Encounter: Payer: Self-pay | Admitting: Internal Medicine

## 2015-02-27 ENCOUNTER — Ambulatory Visit: Payer: BC Managed Care – PPO | Admitting: Physician Assistant

## 2015-02-27 ENCOUNTER — Encounter: Payer: Self-pay | Admitting: Physician Assistant

## 2015-02-27 NOTE — Progress Notes (Signed)
Cardiology Office Note Date:  02/28/2015  Patient ID:  Jessica Baxter, Jessica Baxter December 07, 1940, MRN [id number] PCP:  Katy Apo, MD  Cardiologist: Dr. Eden Emms  Chief Complaint: 1 year f/u of atrial fib  History of Present Illness: Jessica Baxter is a 74 y.o. female with history of chronic atrial fib, HTN, DM (diet-controlled), HLD, intolerance of BB due to fatigue who presents for 1 year f/u. Per chart there have been prior discussions regarding rate control, anticoag and cardioversion and she preferred conservative approach. She has been managed with a strategy of rate control + Eliquis. 2D echo 04/2013: mild LVH, EF 60-65%, indeterminant diastolic fcn due to AF, no RWMA, mod dilated LA.  She comes in for 1 year follow-up doing well from a cardiac standpoint. She is generally unaware of her atrial fibrillation. No chest pain, palpitations, SOB, syncope or bleeding. She saw her PCP a few weeks ago for a physical and reports that everything looked stable on labwork at that time. She has been maintained on long-acting Diltiazem (beads)  daily. She recently asked her PCP to change her diltiazem to generic version because her insurance will not pay for the brand name. A prescription for diltiazem short-acting  three times daily (so  total) was sent in, dose increase for unclear reasons. She planned to start this tomorrow after finishing out her long-acting prescription. She does not like the idea of having to take a medication 3x/day.   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   Past Medical History  Diagnosis Date  . Essential hypertension   . Hypercholesteremia   . Chronic atrial fibrillation (HCC)   . Obesity   . Osteopenia   . Diabetes mellitus [facility])     Past Surgical History  Procedure Laterality Date  . Partial hysterectomy      Current Outpatient Prescriptions  Medication Sig Dispense Refill  . ALPRAZolam (XANAX) 0.25 MG  tablet TAKE 1 TABLET EVERY DAY AS NEEDED FOR ANXIETY  3  . calcium carbonate (OS-CAL) 600 MG TABS tablet Take 600 mg by mouth 2 (two) times daily with a meal.    . COLCRYS 0.6 MG tablet Take 0.6 mg by mouth as needed (FOR GOUT).     Marland Kitchen diltiazem (CARDIZEM) 120 MG tablet TAKE 1 TABLET BY MOUTH 3 TIMES A DAY BEFORE MEALS  1  . ELIQUIS 5 MG TABS tablet TAKE 1 TABLET BY MOUTH TWICE A DAY 60 tablet 3  . furosemide (LASIX) 20 MG tablet Take 20 mg by mouth daily.  6  . GLUCOSAMINE PO Take 1 tablet by mouth daily.    Marland Kitchen KLOR-CON M20 20 MEQ tablet TAKE 1 TABLET (20 MEQ TOTAL) BY MOUTH DAILY. 90 tablet 2  . losartan (COZAAR) 100 MG tablet Take 100 mg by mouth daily.    . Omega-3 Fatty Acids (FISH OIL PO) Take by mouth.    . Red Yeast Rice Extract (RED YEAST RICE PO) Take 1 tablet by mouth daily.    . traMADol (ULTRAM) 50 MG tablet 1 tablet as needed for pain     No current facility-administered medications for this visit.    Allergies:   Review of patient's allergies indicates not on file.   Social History:  The patient  reports that she has never smoked. She does not have any smokeless tobacco history on file. She reports that she does not drink alcohol or use illicit drugs.   Family History:  The patient's family history includes  Hypertension in her mother; Stroke in her brother. There is no history of Heart attack.  ROS:  Please see the history of present illness. Trace ankle edema when swinging or hanging legs that resolves when elevating legs. All other systems are reviewed and otherwise negative.   PHYSICAL EXAM:  VS:  BP 140/60 mmHg  Pulse 96  Ht 5\' 2"  (1.575 m)  Wt 198 lb (89.812 kg)  BMI 36.21 kg/m2 BMI: Body mass index is 36.21 kg/(m^2). Well nourished, well developed lively AAF, in no acute distress HEENT: normocephalic, atraumatic Neck: no JVD, carotid bruits or masses Cardiac:  Irregularly irregular rhythm, no murmurs, rubs, or gallops Lungs:  clear to auscultation bilaterally, no  wheezing, rhonchi or rales Abd: soft, nontender, no hepatomegaly, + BS MS: no deformity or atrophy Ext: trace ankle edema Skin: warm and dry, no rash Neuro:  moves all extremities spontaneously, no focal abnormalities noted, follows commands Psych: euthymic mood, full affect   EKG:  Done today shows atrial fib 92bpm, no acute ST-T changes  Recent Labs: 07/09/2014: BUN 16; Creatinine, Ser 1.04; Hemoglobin 11.7*; Platelets 230.0; Potassium 3.4*; Sodium 137  No results found for requested labs within last 365 days.   CrCl cannot be calculated (Patient has no serum creatinine result on file.).   Wt Readings from Last 3 Encounters:  02/28/15 198 lb (89.812 kg)  02/25/14 192 lb (87.091 kg)  08/22/13 196 lb 12.8 oz (89.268 kg)     Other studies reviewed: Additional studies/records reviewed today include: summarized above  ASSESSMENT AND PLAN:  1. Chronic atrial fib - she has done well on combination of rate control and anticoagulation. She recently asked her PCP to change her diltiazem to generic version because her insurance will not pay for the brand name. I think the issue is that her prior long-acting diltiazem was prescribed as the "beads" form - there usually is no generic for this version. She has been on long-acting 120mg  daily and the new prescription was called in for short-acting 120mg  TID. The reason for the dose titration is unclear to me. We called [facility] to clarify this was in fact the new dose prescribed. Her blood pressure has been slightly elevated recently, thus I think some dose increase would be appropriate (but I think going to 360mg  daily may be too aggressive). We did confirm that [facility] 2 other versions of generic long-acting diltiazem. I have prescribed long-acting generic Diltiazem 180mg  daily. The patient is aware this is a new prescription and higher dose. She knows not to take the TID version. I told the patient to please call us if she has any issues  getting this generic version. I will also request a copy of recent labs from PCP to make sure Cr, potassium, and Hgb are stable. 2. Essential HTN - patient reports BP has been running elevated recently and that today's BP is actually better than usual. Will increase diltiazem as above. She will follow her blood pressure at home. 3. HLD - lipids are being followed by PCP. 4. Obesity - the patient plans to utilize the gym membership she has in the new year. I congratulated her on this decision.  Disposition: F/u with Dr. Eden EmmsNishan in 6 months.  Current medicines are reviewed at length with the patient today.  The patient did not have any concerns regarding medicines.  Thomasene MohairSigned, Dayna Dunn PA-C 02/28/2015 11:23 AM     [facility] [address] [place] [address] [phone] (  office)  [phone] (fax)

## 2015-02-28 ENCOUNTER — Ambulatory Visit (INDEPENDENT_AMBULATORY_CARE_PROVIDER_SITE_OTHER): Payer: BC Managed Care – PPO | Admitting: Physician Assistant

## 2015-02-28 ENCOUNTER — Encounter: Payer: Self-pay | Admitting: Physician Assistant

## 2015-02-28 ENCOUNTER — Other Ambulatory Visit: Payer: Self-pay | Admitting: Physician Assistant

## 2015-02-28 VITALS — BP 140/60 | HR 96 | Ht 62.0 in | Wt 198.0 lb

## 2015-02-28 DIAGNOSIS — I10 Essential (primary) hypertension: Secondary | ICD-10-CM | POA: Diagnosis not present

## 2015-02-28 DIAGNOSIS — I482 Chronic atrial fibrillation, unspecified: Secondary | ICD-10-CM

## 2015-02-28 DIAGNOSIS — E669 Obesity, unspecified: Secondary | ICD-10-CM

## 2015-02-28 DIAGNOSIS — E785 Hyperlipidemia, unspecified: Secondary | ICD-10-CM | POA: Diagnosis not present

## 2015-02-28 MED ORDER — DILTIAZEM HCL ER 180 MG PO CP24: 180.0000 mg | ORAL_CAPSULE | Freq: Every day | ORAL | Status: DC

## 2015-02-28 NOTE — Patient Instructions (Addendum)
Medication Instructions:  Your physician has recommended you make the following change in your medication:  1.  We have changed your Cardizem to Diltiazem 180 mg taking 1 tablet daily (please let us know if you have any issues of getting the generic version of this medication.  DO NOT TAKE THE THREE TIMES A DAY MEDICATION   Labwork: None ordered  Testing/Procedures: None ordered  Follow-Up: Your physician wants you to follow-up in: 6 MONTHS WITH DR. Eden EmmsNISHAN.  You will receive a reminder letter in the mail two months in advance. If you don't receive a letter, please call our office to schedule the follow-up appointment.   Any Other Special Instructions Will Be Listed Below (If Applicable).     If you need a refill on your cardiac medications before your next appointment, please call your pharmacy.

## 2015-04-08 ENCOUNTER — Ambulatory Visit: Payer: BC Managed Care – PPO | Admitting: Podiatry

## 2015-04-30 ENCOUNTER — Other Ambulatory Visit: Payer: Self-pay | Admitting: Cardiovascular Disease

## 2015-08-24 ENCOUNTER — Other Ambulatory Visit: Payer: Self-pay | Admitting: Cardiovascular Disease

## 2015-08-26 ENCOUNTER — Other Ambulatory Visit: Payer: Self-pay | Admitting: Cardiovascular Disease

## 2015-08-29 NOTE — Progress Notes (Signed)
Cardiology Office Note Date:  09/01/2015  Patient ID:  Jessica Baxter, DOB 05/04/1940, MRN [id number] PCP:  Katy ApoPOLITE,RONALD D, MD  Cardiologist: Dr. Eden EmmsNishan  Chief Complaint:  F/u afib   History of Present Illness: Jessica Baxter is a 75 y.o. female with history of chronic atrial fib, HTN, DM (diet-controlled), HLD, intolerance of BB due to fatigue  Per chart there have been prior discussions regarding rate control, anticoag and cardioversion and she preferred conservative approach. She has been managed with a strategy of rate control + Eliquis. 2D echo 04/2013: mild LVH, EF 60-65%, indeterminant diastolic fcn due to AF, no RWMA, mod dilated LA.  She is generally unaware of her atrial fibrillation. 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   Past Medical History  Diagnosis Date  . Essential hypertension   . Hypercholesteremia   . Chronic atrial fibrillation (HCC)   . Obesity   . Osteopenia   . Diabetes mellitus [facility](HCC)     Past Surgical History  Procedure Laterality Date  . Partial hysterectomy      Current Outpatient Prescriptions  Medication Sig Dispense Refill  . ALPRAZolam (XANAX) 0.25 MG tablet TAKE 1 TABLET EVERY DAY AS NEEDED FOR ANXIETY  3  . calcium carbonate (OS-CAL) 600 MG TABS tablet Take 600 mg by mouth 2 (two) times daily with a meal.    . COLCRYS 0.6 MG tablet Take 0.6 mg by mouth as needed (FOR GOUT).     Marland Kitchen. diltiazem (DILACOR XR) 180 MG 24 hr capsule Take 1 capsule (180 mg total) by mouth daily. 90 capsule 3  . ELIQUIS 5 MG TABS tablet TAKE 1 TABLET BY MOUTH TWICE A DAY 180 tablet 1  . furosemide (LASIX) 20 MG tablet Take 20 mg by mouth daily.  6  . GLUCOSAMINE PO Take 1 tablet by mouth daily.    Marland Kitchen. KLOR-CON M20 20 MEQ tablet TAKE 1 TABLET (20 MEQ TOTAL) BY MOUTH DAILY. 90 tablet 2  . losartan (COZAAR) 100 MG tablet Take 100 mg by mouth daily.    . Omega-3 Fatty Acids  (FISH OIL PO) Take by mouth.    . Red Yeast Rice Extract (RED YEAST RICE PO) Take 1 tablet by mouth daily.    . traMADol (ULTRAM) 50 MG tablet 1 tablet as needed for pain     No current facility-administered medications for this visit.    Allergies:   Review of patient's allergies indicates no known allergies.   Social History:  The patient  reports that she has never smoked. She does not have any smokeless tobacco history on file. She reports that she does not drink alcohol or use illicit drugs.   Family History:  The patient's family history includes Hypertension in her mother; Stroke in her brother. There is no history of Heart attack.  ROS:  Please see the history of present illness. Trace ankle edema when swinging or hanging legs that resolves when elevating legs. All other systems are reviewed and otherwise negative.   PHYSICAL EXAM:  VS:  There were no vitals taken for this visit. BMI: There is no weight on file to calculate BMI. Well nourished, well developed lively AAF, in no acute distress HEENT: normocephalic, atraumatic Neck: no JVD, carotid bruits or masses Cardiac:  Irregularly irregular rhythm, no murmurs, rubs, or gallops Lungs:  clear to auscultation bilaterally, no wheezing, rhonchi or rales Abd: soft, nontender, no hepatomegaly, +  BS MS: no deformity or atrophy Ext: trace ankle edema Skin: warm and dry, no rash Neuro:  moves all extremities spontaneously, no focal abnormalities noted, follows commands Psych: euthymic mood, full affect   EKG:  Done today shows atrial fib 92bpm, no acute ST-T changes  Recent Labs: No results found for requested labs within last 365 days.  No results found for requested labs within last 365 days.   CrCl cannot be calculated (Unknown ideal weight.).   Wt Readings from Last 3 Encounters:  02/28/15 198 lb (89.812 kg)  02/25/14 192 lb (87.091 kg)  08/22/13 196 lb 12.8 oz (89.268 kg)     Other studies reviewed: Additional  studies/records reviewed today include: summarized above  ASSESSMENT AND PLAN:  1. Chronic atrial fib - good rate control and anticoagulation  2. Essential HTN - patient reports BP has been running elevated recently and that today's BP is actually better than usual. Will increase diltiazem as above. She will follow her blood pressure at home. 3. HLD - lipids are being followed by PCP. 4. Obesity - the patient plans to utilize the gym membership she has in the new year. I congratulated her on this decision.  Disposition: F/u with me in a year   Current medicines are reviewed at length with the patient today.  The patient did not have any concerns regarding medicines.  Charlton HawsPeter Carren Blakley

## 2015-09-01 ENCOUNTER — Ambulatory Visit (INDEPENDENT_AMBULATORY_CARE_PROVIDER_SITE_OTHER): Payer: BC Managed Care – PPO | Admitting: Cardiovascular Disease

## 2015-09-01 VITALS — BP 138/70 | HR 82 | Resp 11 | Ht 62.0 in | Wt 194.0 lb

## 2015-09-01 DIAGNOSIS — I482 Chronic atrial fibrillation, unspecified: Secondary | ICD-10-CM

## 2015-09-01 DIAGNOSIS — I10 Essential (primary) hypertension: Secondary | ICD-10-CM

## 2015-09-01 NOTE — Patient Instructions (Addendum)

## 2016-01-13 ENCOUNTER — Other Ambulatory Visit: Payer: Self-pay | Admitting: Internal Medicine

## 2016-01-13 DIAGNOSIS — Z1231 Encounter for screening mammogram for malignant neoplasm of breast: Secondary | ICD-10-CM

## 2016-02-12 ENCOUNTER — Other Ambulatory Visit: Payer: Self-pay | Admitting: Physician Assistant

## 2016-02-14 ENCOUNTER — Other Ambulatory Visit: Payer: Self-pay | Admitting: Cardiovascular Disease

## 2016-02-16 ENCOUNTER — Ambulatory Visit
Admission: RE | Admit: 2016-02-16 | Discharge: 2016-02-16 | Disposition: A | Discharge status: Home / Self Care | Payer: BC Managed Care – PPO | Source: Ambulatory Visit | Attending: Internal Medicine | Admitting: Internal Medicine

## 2016-02-16 DIAGNOSIS — Z1231 Encounter for screening mammogram for malignant neoplasm of breast: Secondary | ICD-10-CM

## 2016-02-18 ENCOUNTER — Other Ambulatory Visit: Payer: Self-pay | Admitting: Internal Medicine

## 2016-02-18 DIAGNOSIS — R928 Other abnormal and inconclusive findings on diagnostic imaging of breast: Secondary | ICD-10-CM

## 2016-02-20 ENCOUNTER — Ambulatory Visit
Admission: RE | Admit: 2016-02-20 | Discharge: 2016-02-20 | Disposition: A | Discharge status: Home / Self Care | Payer: BC Managed Care – PPO | Source: Ambulatory Visit | Attending: Internal Medicine | Admitting: Internal Medicine

## 2016-02-20 DIAGNOSIS — R928 Other abnormal and inconclusive findings on diagnostic imaging of breast: Secondary | ICD-10-CM

## 2016-02-24 ENCOUNTER — Other Ambulatory Visit: Payer: BC Managed Care – PPO

## 2016-06-16 ENCOUNTER — Other Ambulatory Visit: Payer: Self-pay | Admitting: Cardiovascular Disease

## 2016-08-09 ENCOUNTER — Other Ambulatory Visit: Payer: Self-pay | Admitting: Cardiovascular Disease

## 2016-09-13 ENCOUNTER — Other Ambulatory Visit: Payer: Self-pay | Admitting: Physician Assistant

## 2016-09-13 ENCOUNTER — Encounter: Payer: Self-pay | Admitting: *Deleted

## 2016-09-25 NOTE — Progress Notes (Signed)
Cardiology Office Note Date:  09/27/2016  Patient ID:  Jessica Baxter, DOB 05/04/1940, MRN [id number] PCP:  Renford DillsPolite, Ronald, MD  Cardiologist: Dr. Eden EmmsNishan  Chief Complaint:  F/u afib   History of Present Illness: Jessica Baxter is a 76 y.o. female with history of chronic atrial fib, HTN, DM (diet-controlled), HLD, intolerance of BB due to fatigue  Per chart there have been prior discussions regarding rate control, anticoag and cardioversion and she preferred conservative approach. She has been managed with a strategy of rate control + Eliquis. 2D echo 04/2013: mild LVH, EF 60-65%, indeterminant diastolic fcn due to AF, no RWMA, mod dilated LA.  She is generally unaware of her atrial fibrillation. 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   Past Medical History:  Diagnosis Date  . Chronic atrial fibrillation (HCC)   . Diabetes mellitus (HCC)   . Essential hypertension   . Hypercholesteremia   . Obesity   . Osteopenia     Past Surgical History:  Procedure Laterality Date  . PARTIAL HYSTERECTOMY      Current Outpatient Prescriptions  Medication Sig Dispense Refill  . ALPRAZolam (XANAX) 0.25 MG tablet TAKE 1 TABLET EVERY DAY AS NEEDED FOR ANXIETY  3  . calcium carbonate (OS-CAL) 600 MG TABS tablet Take 600 mg by mouth 2 (two) times daily with a meal.    . COLCRYS 0.6 MG tablet Take 0.6 mg by mouth as needed (FOR GOUT).     Marland Kitchen. diltiazem (CARDIZEM CD) 180 MG 24 hr capsule TAKE ONE CAPSULE BY MOUTH EVERY DAY 90 capsule 0  . ELIQUIS 5 MG TABS tablet TAKE 1 TABLET BY MOUTH TWICE A DAY 180 tablet 1  . furosemide (LASIX) 20 MG tablet Take 20 mg by mouth daily.  6  . KLOR-CON M20 20 MEQ tablet TAKE 1 TABLET (20 MEQ TOTAL) BY MOUTH DAILY. 90 tablet 1  . losartan (COZAAR) 100 MG tablet Take 100 mg by mouth daily.    . Omega-3 Fatty Acids (FISH OIL PO) Take by mouth.    . Red Yeast Rice Extract  (RED YEAST RICE PO) Take 1 tablet by mouth daily.    . traMADol (ULTRAM) 50 MG tablet 1 tablet as needed for pain     No current facility-administered medications for this visit.     Allergies:   Patient has no known allergies.   Social History:  The patient  reports that she has never smoked. She has never used smokeless tobacco. She reports that she does not drink alcohol or use drugs.   Family History:  The patient's family history includes Hypertension in her mother; Stroke in her brother.  ROS:  Please see the history of present illness. Trace ankle edema when swinging or hanging legs that resolves when elevating legs. All other systems are reviewed and otherwise negative.   PHYSICAL EXAM:  VS:  BP 134/76   Pulse 88   Ht 5' 2.5" (1.588 m)   Wt 190 lb (86.2 kg)   SpO2 97%   BMI 34.20 kg/m  BMI: Body mass index is 34.2 kg/m.    Affect appropriate Healthy:  appears stated age HEENT: normal Neck supple with no adenopathy JVP normal no bruits no thyromegaly Lungs clear with no wheezing and good diaphragmatic motion Heart:  S1/S2 no murmur, no rub, gallop or click PMI normal Abdomen: benighn, BS positve, no tenderness, no AAA no bruit.  No HSM or HJR Distal pulses intact with no bruits No edema Neuro non-focal Skin warm and dry No muscular weakness    EKG:  Done today shows atrial fib 92bpm, no acute ST-T changes 09/27/16  Afib rate 88 no changes   Recent Labs: No results found for requested labs within last 8760 hours.  No results found for requested labs within last 8760 hours.   CrCl cannot be calculated (Patient's most recent lab result is older than the maximum 21 days allowed.).   Wt Readings from Last 3 Encounters:  09/27/16 190 lb (86.2 kg)  09/01/15 194 lb (88 kg)  02/28/15 198 lb (89.8 kg)     Other studies reviewed: Additional studies/records reviewed today include: summarized above  ASSESSMENT AND PLAN:  1. Chronic atrial fib - good rate control  and anticoagulation  2. Essential HTN - better on higher dose cardizem 3. HLD - lipids are being followed by PCP. 4. Obesity - the patient plans to utilize the gym membership she has in the new year. I congratulated her on this decision.  Disposition: F/u with me in a year   Current medicines are reviewed at length with the patient today.  The patient did not have any concerns regarding medicines.  Charlton HawsPeter Ilhan Debenedetto

## 2016-09-27 ENCOUNTER — Ambulatory Visit (INDEPENDENT_AMBULATORY_CARE_PROVIDER_SITE_OTHER): Payer: Medicare Other | Admitting: Cardiovascular Disease

## 2016-09-27 VITALS — BP 134/76 | HR 88 | Ht 62.5 in | Wt 190.0 lb

## 2016-09-27 DIAGNOSIS — I10 Essential (primary) hypertension: Secondary | ICD-10-CM

## 2016-09-27 DIAGNOSIS — I482 Chronic atrial fibrillation, unspecified: Secondary | ICD-10-CM

## 2016-09-27 NOTE — Patient Instructions (Addendum)

## 2016-12-13 ENCOUNTER — Other Ambulatory Visit: Payer: Self-pay | Admitting: Cardiovascular Disease

## 2017-01-13 ENCOUNTER — Other Ambulatory Visit: Payer: Self-pay | Admitting: Internal Medicine

## 2017-01-13 DIAGNOSIS — Z1231 Encounter for screening mammogram for malignant neoplasm of breast: Secondary | ICD-10-CM

## 2017-02-11 ENCOUNTER — Other Ambulatory Visit: Payer: Self-pay | Admitting: Cardiovascular Disease

## 2017-02-11 NOTE — Telephone Encounter (Signed)
Eliquis 5mg  refill request received; pt is 76 yrs old, wt-86.2kg, Crea-1.16 on 08/17/16,last seen by Dr. Eden EmmsNishan on 09/27/16; will send in refill request to requested Pharmacy.

## 2017-02-17 ENCOUNTER — Ambulatory Visit
Admission: RE | Admit: 2017-02-17 | Discharge: 2017-02-17 | Disposition: A | Discharge status: Home / Self Care | Payer: Medicare Other | Source: Ambulatory Visit | Attending: Internal Medicine | Admitting: Internal Medicine

## 2017-02-17 DIAGNOSIS — Z1231 Encounter for screening mammogram for malignant neoplasm of breast: Secondary | ICD-10-CM

## 2017-02-18 ENCOUNTER — Other Ambulatory Visit: Payer: Self-pay | Admitting: Internal Medicine

## 2017-02-18 DIAGNOSIS — R928 Other abnormal and inconclusive findings on diagnostic imaging of breast: Secondary | ICD-10-CM

## 2017-02-24 ENCOUNTER — Ambulatory Visit: Admission: RE | Admit: 2017-02-24 | Payer: Medicare Other | Source: Ambulatory Visit

## 2017-02-24 ENCOUNTER — Ambulatory Visit
Admission: RE | Admit: 2017-02-24 | Discharge: 2017-02-24 | Disposition: A | Discharge status: Home / Self Care | Payer: Medicare Other | Source: Ambulatory Visit | Attending: Internal Medicine | Admitting: Internal Medicine

## 2017-02-24 DIAGNOSIS — R928 Other abnormal and inconclusive findings on diagnostic imaging of breast: Secondary | ICD-10-CM

## 2017-07-08 ENCOUNTER — Other Ambulatory Visit: Payer: Self-pay | Admitting: Cardiovascular Disease

## 2017-08-06 ENCOUNTER — Other Ambulatory Visit: Payer: Self-pay | Admitting: Cardiovascular Disease

## 2017-08-08 NOTE — Telephone Encounter (Signed)
Age 77 Wt  86.2kg  09/27/2016 Saw Dr Eden EmmsNishan on 09/27/2016 08/17/2016 SrCr  1.16  Hgb 12.2 HCT 36.3 Has appt to see Dr Eden EmmsNishan on 10/03/2017  Refill done for Eliquis 5mg  q 12 hours as requested

## 2017-09-06 ENCOUNTER — Other Ambulatory Visit: Payer: Self-pay | Admitting: Cardiovascular Disease

## 2017-09-30 NOTE — Progress Notes (Signed)
Cardiology Office Note Date:  10/03/2017  Patient ID:  Jessica Baxter, Jessica Baxter Mar 07, 1940, MRN [id number] PCP:  Renford Dills, MD  Cardiologist: Dr. Eden Emms  Chief Complaint:  F/u afib   History of Present Illness: Chantelle Verdi is a 77 y.o. female with history of chronic atrial fib, HTN, DM (diet-controlled), HLD, intolerance of BB due to fatigue  Per chart there have been prior discussions regarding rate control, anticoag and cardioversion and she preferred conservative approach. She has been managed with a strategy of rate control + Eliquis. 2D echo 04/2013: mild LVH, EF 60-65%, indeterminant diastolic fcn due to AF, no RWMA, mod dilated LA.  She is generally unaware of her atrial fibrillation. No chest pain, palpitations, SOB, syncope or bleeding   She retired last year and has gained some weight and has increased edema   Past Medical History:  Diagnosis Date  . Chronic atrial fibrillation (HCC)   . Diabetes mellitus (HCC)   . Essential hypertension   . Hypercholesteremia   . Obesity   . Osteopenia     Past Surgical History:  Procedure Laterality Date  . PARTIAL HYSTERECTOMY      Current Outpatient Medications  Medication Sig Dispense Refill  . ALPRAZolam (XANAX) 0.25 MG tablet TAKE 1 TABLET EVERY DAY AS NEEDED FOR ANXIETY  3  . calcium carbonate (OS-CAL) 600 MG TABS tablet Take 600 mg by mouth 2 (two) times daily with a meal.    . COLCRYS 0.6 MG tablet Take 0.6 mg by mouth as needed (FOR GOUT).     Marland Kitchen diltiazem (CARDIZEM CD) 180 MG 24 hr capsule TAKE 1 CAPSULE BY MOUTH EVERY DAY 90 capsule 0  . ELIQUIS 5 MG TABS tablet TAKE 1 TABLET BY MOUTH TWICE A DAY 180 tablet 1  . furosemide (LASIX) 20 MG tablet Take 20 mg by mouth daily.  6  . losartan (COZAAR) 100 MG tablet Take 100 mg by mouth daily.    . Omega-3 Fatty Acids (FISH OIL PO) Take by mouth as directed.     . potassium chloride SA (KLOR-CON M20) 20 MEQ tablet Take 1 tablet (20 mEq total) by mouth daily. Please make  yearly appt with Dr. Eden Emms for July before anymore refills. 1st attempt 90 tablet 0  . Red Yeast Rice Extract (RED YEAST RICE PO) Take 1 tablet by mouth daily.    . traMADol (ULTRAM) 50 MG tablet 1 tablet as needed for pain     No current facility-administered medications for this visit.     Allergies:   Patient has no known allergies.   Social History:  The patient  reports that she has never smoked. She has never used smokeless tobacco. She reports that she does not drink alcohol or use drugs.   Family History:  The patient's family history includes Hypertension in her mother; Stroke in her brother.  ROS:  Please see the history of present illness. Trace ankle edema when swinging or hanging legs that resolves when elevating legs. All other systems are reviewed and otherwise negative.   PHYSICAL EXAM:  VS:  BP 138/86   Pulse (!) 107   Ht 5' 2.5" (1.588 m)   Wt 199 lb 12 oz (90.6 kg)   SpO2 99%   BMI 35.95 kg/m  BMI: Body mass index is 35.95 kg/m.    Affect appropriate Healthy:  appears stated age HEENT: normal Neck supple with no adenopathy JVP normal no bruits no thyromegaly Lungs clear with no wheezing and good diaphragmatic motion  Heart:  S1/S2 no murmur, no rub, gallop or click PMI normal Abdomen: benighn, BS positve, no tenderness, no AAA no bruit.  No HSM or HJR Distal pulses intact with no bruits Plus 2 LE edema Neuro non-focal Skin warm and dry No muscular weakness    EKG:  Done today shows atrial fib 92bpm, no acute ST-T changes 09/27/16  Afib rate 88 no changes  10/03/17  afib non specific ST changes    CrCl cannot be calculated (Patient's most recent lab result is older than the maximum 21 days allowed.).   Wt Readings from Last 3 Encounters:  10/03/17 199 lb 12 oz (90.6 kg)  09/27/16 190 lb (86.2 kg)  09/01/15 194 lb (88 kg)     Other studies reviewed: Additional studies/records reviewed today include: summarized above  ASSESSMENT AND  PLAN:  1. Chronic atrial fib - good rate control and anticoagulation  2. Essential HTN - Well controlled.  Continue current medications and low sodium Dash type diet.   3. HLD - lipids are being followed by PCP.Using red yeast rice  4. Obesity - the patient plans to utilize the gym membership she has in the new year. I congratulated her on this decision. 5. DM-2  Diet controlled continue portion control and low carbs A1c with primary  6.   Edema:  Dependant from diet and weight gain continue lasix in afternoon   Disposition: F/u with me in a year   Current medicines are reviewed at length with the patient today.  The patient did not have any concerns regarding medicines.  Charlton HawsPeter Nishan

## 2017-10-03 ENCOUNTER — Ambulatory Visit: Payer: Medicare Other | Admitting: Cardiovascular Disease

## 2017-10-03 ENCOUNTER — Encounter: Payer: Self-pay | Admitting: Cardiovascular Disease

## 2017-10-03 VITALS — BP 138/86 | HR 107 | Ht 62.5 in | Wt 199.8 lb

## 2017-10-03 DIAGNOSIS — I10 Essential (primary) hypertension: Secondary | ICD-10-CM | POA: Diagnosis not present

## 2017-10-03 DIAGNOSIS — I482 Chronic atrial fibrillation, unspecified: Secondary | ICD-10-CM

## 2017-10-03 NOTE — Addendum Note (Signed)
Addended by: Weber CooksOOPER, Verne Lanuza on: 10/03/2017 04:24 PM   Modules accepted: Orders

## 2017-10-03 NOTE — Patient Instructions (Addendum)

## 2017-10-08 ENCOUNTER — Other Ambulatory Visit: Payer: Self-pay | Admitting: Cardiovascular Disease

## 2017-12-05 ENCOUNTER — Other Ambulatory Visit: Payer: Self-pay | Admitting: Cardiovascular Disease

## 2018-01-10 ENCOUNTER — Other Ambulatory Visit: Payer: Self-pay | Admitting: Internal Medicine

## 2018-01-10 DIAGNOSIS — Z1231 Encounter for screening mammogram for malignant neoplasm of breast: Secondary | ICD-10-CM

## 2018-01-31 ENCOUNTER — Other Ambulatory Visit: Payer: Self-pay | Admitting: Cardiovascular Disease

## 2018-01-31 NOTE — Telephone Encounter (Signed)
Pt is a 77 yr old female who saw Dr Eden EmmsNishan on 10/03/17, wt at that visit was 90.6Kg. [facility] Will refill Eliquis 5mg  BID.

## 2018-02-09 ENCOUNTER — Ambulatory Visit (HOSPITAL_COMMUNITY)
Admission: EM | Admit: 2018-02-09 | Discharge: 2018-02-09 | Disposition: A | Discharge status: Home / Self Care | Payer: Medicare Other | Attending: Family Medicine | Admitting: Family Medicine

## 2018-02-09 ENCOUNTER — Encounter (HOSPITAL_COMMUNITY): Payer: Self-pay | Admitting: Family Medicine

## 2018-02-09 DIAGNOSIS — R05 Cough: Secondary | ICD-10-CM | POA: Diagnosis not present

## 2018-02-09 DIAGNOSIS — B9789 Other viral agents as the cause of diseases classified elsewhere: Secondary | ICD-10-CM | POA: Diagnosis not present

## 2018-02-09 DIAGNOSIS — J069 Acute upper respiratory infection, unspecified: Secondary | ICD-10-CM | POA: Insufficient documentation

## 2018-02-09 HISTORY — DX: Unspecified atrial fibrillation: I48.91

## 2018-02-09 MED ORDER — METHYLPREDNISOLONE ACETATE 80 MG/ML IJ SUSP
INTRAMUSCULAR | Status: AC
  Filled 2018-02-09: qty 1

## 2018-02-09 MED ORDER — BENZONATATE 100 MG PO CAPS: 100.0000 mg | ORAL_CAPSULE | Freq: Three times a day (TID) | ORAL | 0 refills | Status: DC | PRN

## 2018-02-09 MED ORDER — METHYLPREDNISOLONE ACETATE 80 MG/ML IJ SUSP
80.0000 mg | Freq: Once | INTRAMUSCULAR | Status: AC
Start: 2018-02-09 — End: 2018-02-09
  Administered 2018-02-09: 80 mg via INTRAMUSCULAR

## 2018-02-09 MED ORDER — ALBUTEROL SULFATE HFA 108 (90 BASE) MCG/ACT IN AERS: 2.0000 | INHALATION_SPRAY | RESPIRATORY_TRACT | 1 refills | Status: AC | PRN

## 2018-02-09 NOTE — ED Triage Notes (Signed)
Pt presents to ED for assessment of cough and congestion x 3 days.

## 2018-02-09 NOTE — ED Provider Notes (Signed)
[facility]    CSN: [id number] Arrival date & time: 02/09/18  1324     History   Chief Complaint Chief Complaint  Patient presents with  . URI    HPI Jessica Baxter is a 77 y.o. female.   This is an initial urgent care visit for this 77 year old woman.  Pt presents to ED for assessment of cough and congestion x 3 days.  She is a non-smoker and has never had asthma.  With this episode, patient has not had any fever.  She has been trying Mucinex D and Creomulsion for the cough.  This is helped a little bit.  Patient is not short of breath.  She had no increasing leg edema.  She has had no chest pain or chest tightness.  In the past patient has had similar cough and she has taken an inhaler and got a shot from Dr. Nehemiah Settle     Past Medical History:  Diagnosis Date  . A-fib (HCC)   . Chronic atrial fibrillation   . Diabetes mellitus (HCC)   . Essential hypertension   . Hypercholesteremia   . Obesity   . Osteopenia     Patient Active Problem List   Diagnosis Date Noted  . Hypertension   . Hypercholesteremia   . Chronic atrial fibrillation   . Overweight(278.02)   . Osteopenia   . Impaired fasting glucose   . DYSPHAGIA 02/03/2009    Past Surgical History:  Procedure Laterality Date  . PARTIAL HYSTERECTOMY      OB History   No obstetric history on file.      Home Medications    Prior to Admission medications   Medication Sig Start Date End Date Taking? Authorizing Provider  albuterol (PROVENTIL HFA;VENTOLIN HFA) 108 (90 Base) MCG/ACT inhaler Inhale 2 puffs into the lungs every 4 (four) hours as needed for wheezing or shortness of breath (cough, shortness of breath or wheezing.). 02/09/18   Elvina Sidle, MD  ALPRAZolam Prudy Feeler) 0.25 MG tablet TAKE 1 TABLET EVERY DAY AS NEEDED FOR ANXIETY 10/29/14   [provider]  benzonatate (TESSALON) 100 MG capsule Take 1-2 capsules (100-200 mg total) by mouth 3 (three) times daily as needed for  cough. 02/09/18   Elvina Sidle, MD  calcium carbonate (OS-CAL) 600 MG TABS tablet Take 600 mg by mouth 2 (two) times daily with a meal.    [provider]  COLCRYS 0.6 MG tablet Take 0.6 mg by mouth as needed (FOR GOUT).  05/01/13   [provider]  diltiazem (CARDIZEM CD) 180 MG 24 hr capsule TAKE 1 CAPSULE BY MOUTH EVERY DAY 12/05/17   Wendall Stade, MD  ELIQUIS 5 MG TABS tablet TAKE 1 TABLET BY MOUTH TWICE A DAY 01/31/18   Wendall Stade, MD  furosemide (LASIX) 20 MG tablet Take 20 mg by mouth daily. 11/09/14   [provider]  losartan (COZAAR) 100 MG tablet Take 100 mg by mouth daily.    [provider]  Omega-3 Fatty Acids (FISH OIL PO) Take by mouth as directed.     [provider]  potassium chloride SA (KLOR-CON M20) 20 MEQ tablet Take 1 tablet (20 mEq total) by mouth once for 1 dose. 10/10/17 10/10/17  Wendall Stade, MD  Red Yeast Rice Extract (RED YEAST RICE PO) Take 1 tablet by mouth daily.    [provider]  traMADol Janean Sark) 50 MG tablet 1 tablet as needed for pain 01/03/12   [provider]  Family History Family History  Problem Relation Age of Onset  . Hypertension Mother   . Stroke Brother   . Heart attack Neg Hx     Social History Social History   Tobacco Use  . Smoking status: Never Smoker  . Smokeless tobacco: Never Used  Substance Use Topics  . Alcohol use: No  . Drug use: No     Allergies   Patient has no known allergies.   Review of Systems Review of Systems   Physical Exam Triage Vital Signs ED Triage Vitals  Enc Vitals Group     BP      Pulse      Resp      Temp      Temp src      SpO2      Weight      Height      Head Circumference      Peak Flow      Pain Score      Pain Loc      Pain Edu?      Excl. in GC?    No data found.  Updated Vital Signs BP (!) 166/81 (BP Location: Left Arm)   Pulse (!) 112   Temp 98.7 F (37.1 C) (Oral)   Resp 20   SpO2 100%    Physical Exam Vitals signs and nursing note reviewed.  Constitutional:      Appearance: Normal appearance.  HENT:     Head: Normocephalic and atraumatic.     Right Ear: Tympanic membrane and external ear normal.     Left Ear: Tympanic membrane and external ear normal.     Nose: Nose normal.     Mouth/Throat:     Mouth: Mucous membranes are moist.  Eyes:     Conjunctiva/sclera: Conjunctivae normal.  Neck:     Musculoskeletal: Normal range of motion and neck supple.  Cardiovascular:     Rate and Rhythm: Normal rate. Rhythm irregular.     Heart sounds: No murmur.  Pulmonary:     Effort: Pulmonary effort is normal.     Breath sounds: Wheezing, rhonchi and rales present.  Musculoskeletal: Normal range of motion.        General: No swelling.     Right lower leg: Edema present.     Left lower leg: Edema present.  Skin:    General: Skin is warm and dry.  Neurological:     General: No focal deficit present.     Mental Status: She is alert.  Psychiatric:        Mood and Affect: Mood normal.        Behavior: Behavior normal.        Thought Content: Thought content normal.        Judgment: Judgment normal.      UC Treatments / Results  Labs (all labs ordered are listed, but only abnormal results are displayed) Labs Reviewed - No data to display  EKG None  Radiology No results found.  Procedures Procedures (including critical care time)  Medications Ordered in UC Medications  methylPREDNISolone acetate (DEPO-MEDROL) injection 80 mg (has no administration in time range)    Initial Impression / Assessment and Plan / UC Course  I have reviewed the triage vital signs and the nursing notes.  Pertinent labs & imaging results that were available during my care of the patient were reviewed by me and considered in my medical decision making (see chart for details).  Final Clinical Impressions(s) / UC Diagnoses   Final diagnoses:  Viral URI with cough   Discharge  Instructions   None    ED Prescriptions    Medication Sig Dispense Auth. Provider   albuterol (PROVENTIL HFA;VENTOLIN HFA) 108 (90 Base) MCG/ACT inhaler Inhale 2 puffs into the lungs every 4 (four) hours as needed for wheezing or shortness of breath (cough, shortness of breath or wheezing.). 1 Inhaler Elvina Sidle, MD   benzonatate (TESSALON) 100 MG capsule Take 1-2 capsules (100-200 mg total) by mouth 3 (three) times daily as needed for cough. 40 capsule Elvina Sidle, MD     Controlled Substance Prescriptions [place] Controlled Substance Registry consulted? Not Applicable   Elvina Sidle, MD 02/09/18 1002

## 2018-02-27 ENCOUNTER — Ambulatory Visit
Admission: RE | Admit: 2018-02-27 | Discharge: 2018-02-27 | Disposition: A | Discharge status: Home / Self Care | Payer: Medicare Other | Source: Ambulatory Visit | Attending: Internal Medicine | Admitting: Internal Medicine

## 2018-02-27 DIAGNOSIS — Z1231 Encounter for screening mammogram for malignant neoplasm of breast: Secondary | ICD-10-CM

## 2018-03-06 ENCOUNTER — Encounter: Payer: Self-pay | Admitting: Podiatry

## 2018-03-06 ENCOUNTER — Ambulatory Visit: Payer: Medicare Other | Admitting: Podiatry

## 2018-03-06 VITALS — BP 151/71

## 2018-03-06 DIAGNOSIS — B351 Tinea unguium: Secondary | ICD-10-CM

## 2018-03-06 DIAGNOSIS — M79674 Pain in right toe(s): Secondary | ICD-10-CM

## 2018-03-06 DIAGNOSIS — M79675 Pain in left toe(s): Secondary | ICD-10-CM | POA: Diagnosis not present

## 2018-03-06 DIAGNOSIS — L84 Corns and callosities: Secondary | ICD-10-CM

## 2018-03-06 DIAGNOSIS — Z9229 Personal history of other drug therapy: Secondary | ICD-10-CM | POA: Diagnosis not present

## 2018-03-06 DIAGNOSIS — E663 Overweight: Secondary | ICD-10-CM | POA: Insufficient documentation

## 2018-03-06 DIAGNOSIS — E119 Type 2 diabetes mellitus without complications: Secondary | ICD-10-CM | POA: Diagnosis not present

## 2018-03-06 NOTE — Patient Instructions (Addendum)
Diabetes Mellitus and Foot Care Foot care is an important part of your health, especially when you have diabetes. Diabetes may cause you to have problems because of poor blood flow (circulation) to your feet and legs, which can cause your skin to:  Become thinner and drier.  Break more easily.  Heal more slowly.  Peel and crack. You may also have nerve damage (neuropathy) in your legs and feet, causing decreased feeling in them. This means that you may not notice minor injuries to your feet that could lead to more serious problems. Noticing and addressing any potential problems early is the best way to prevent future foot problems. How to care for your feet Foot hygiene  Wash your feet daily with warm water and mild soap. Do not use hot water. Then, pat your feet and the areas between your toes until they are completely dry. Do not soak your feet as this can dry your skin.  Trim your toenails straight across. Do not dig under them or around the cuticle. File the edges of your nails with an emery board or nail file.  Apply a moisturizing lotion or petroleum jelly to the skin on your feet and to dry, brittle toenails. Use lotion that does not contain alcohol and is unscented. Do not apply lotion between your toes. Shoes and socks  Wear clean socks or stockings every day. Make sure they are not too tight. Do not wear knee-high stockings since they may decrease blood flow to your legs.  Wear shoes that fit properly and have enough cushioning. Always look in your shoes before you put them on to be sure there are no objects inside.  To break in new shoes, wear them for just a few hours a day. This prevents injuries on your feet. Wounds, scrapes, corns, and calluses  Check your feet daily for blisters, cuts, bruises, sores, and redness. If you cannot see the bottom of your feet, use a mirror or ask someone for help.  Do not cut corns or calluses or try to remove them with medicine.  If you  find a minor scrape, cut, or break in the skin on your feet, keep it and the skin around it clean and dry. You may clean these areas with mild soap and water. Do not clean the area with peroxide, alcohol, or iodine.  If you have a wound, scrape, corn, or callus on your foot, look at it several times a day to make sure it is healing and not infected. Check for: ? Redness, swelling, or pain. ? Fluid or blood. ? Warmth. ? Pus or a bad smell. General instructions  Do not cross your legs. This may decrease blood flow to your feet.  Do not use heating pads or hot water bottles on your feet. They may burn your skin. If you have lost feeling in your feet or legs, you may not know this is happening until it is too late.  Protect your feet from hot and cold by wearing shoes, such as at the beach or on hot pavement.  Schedule a complete foot exam at least once a year (annually) or more often if you have foot problems. If you have foot problems, report any cuts, sores, or bruises to your health care provider immediately. Contact a health care provider if:  You have a medical condition that increases your risk of infection and you have any cuts, sores, or bruises on your feet.  You have an injury that is not   healing.  You have redness on your legs or feet.  You feel burning or tingling in your legs or feet.  You have pain or cramps in your legs and feet.  Your legs or feet are numb.  Your feet always feel cold.  You have pain around a toenail. Get help right away if:  You have a wound, scrape, corn, or callus on your foot and: ? You have pain, swelling, or redness that gets worse. ? You have fluid or blood coming from the wound, scrape, corn, or callus. ? Your wound, scrape, corn, or callus feels warm to the touch. ? You have pus or a bad smell coming from the wound, scrape, corn, or callus. ? You have a fever. ? You have a red line going up your leg. Summary  Check your feet every day  for cuts, sores, red spots, swelling, and blisters.  Moisturize feet and legs daily.  Wear shoes that fit properly and have enough cushioning.  If you have foot problems, report any cuts, sores, or bruises to your health care provider immediately.  Schedule a complete foot exam at least once a year (annually) or more often if you have foot problems. This information is not intended to replace advice given to you by your health care provider. Make sure you discuss any questions you have with your health care provider. Document Released: 02/13/2000 Document Revised: 03/30/2017 Document Reviewed: 03/19/2016 Elsevier Interactive Patient Education  2019 [employer]  Onychomycosis/Fungal Toenails  WHAT IS IT? An infection that lies within the keratin of your nail plate that is caused by a fungus.  WHY ME? Fungal infections affect all ages, sexes, races, and creeds.  There may be many factors that predispose you to a fungal infection such as age, coexisting medical conditions such as diabetes, or an autoimmune disease; stress, medications, fatigue, genetics, etc.  Bottom line: fungus thrives in a warm, moist environment and your shoes offer such a location.  IS IT CONTAGIOUS? Theoretically, yes.  You do not want to share shoes, nail clippers or files with someone who has fungal toenails.  Walking around barefoot in the same room or sleeping in the same bed is unlikely to transfer the organism.  It is important to realize, however, that fungus can spread easily from one nail to the next on the same foot.  HOW DO WE TREAT THIS?  There are several ways to treat this condition.  Treatment may depend on many factors such as age, medications, pregnancy, liver and kidney conditions, etc.  It is best to ask your doctor which options are available to you.  1. No treatment.   Unlike many other medical concerns, you can live with this condition.  However for many people this can be a painful condition and  may lead to ingrown toenails or a bacterial infection.  It is recommended that you keep the nails cut short to help reduce the amount of fungal nail. 2. Topical treatment.  These range from herbal remedies to prescription strength nail lacquers.  About 40-50% effective, topicals require twice daily application for approximately 9 to 12 months or until an entirely new nail has grown out.  The most effective topicals are medical grade medications available through physicians offices. 3. Oral antifungal medications.  With an 80-90% cure rate, the most common oral medication requires 3 to 4 months of therapy and stays in your system for a year as the new nail grows out.  Oral antifungal medications do require   blood work to make sure it is a safe drug for you.  A liver function panel will be performed prior to starting the medication and after the first month of treatment.  It is important to have the blood work performed to avoid any harmful side effects.  In general, this medication safe but blood work is required. 4. Laser Therapy.  This treatment is performed by applying a specialized laser to the affected nail plate.  This therapy is noninvasive, fast, and non-painful.  It is not covered by insurance and is therefore, out of pocket.  The results have been very good with a 80-95% cure rate.  [facility] is the only practice in the area to offer this therapy. 5. Permanent Nail Avulsion.  Removing the entire nail so that a new nail will not grow back. 

## 2018-03-28 ENCOUNTER — Encounter: Payer: Self-pay | Admitting: Podiatry

## 2018-03-28 NOTE — Progress Notes (Signed)
Subjective: Jessica Baxter presents today referred by Renford DillsPolite, Ronald, MD with cc of painful, discolored, thick toenails.  Duration is approximately 1 month.  She describes pain which interferes with daily activities.  Pain is aggravated when wearing enclosed shoe gear.  She is afraid to trim her toenails due to her being diabetic.  She is also on blood thinner.   Past Medical History:  Diagnosis Date  . A-fib (HCC)   . Chronic atrial fibrillation   . Diabetes mellitus (HCC)   . Essential hypertension   . Hypercholesteremia   . Obesity   . Osteopenia      Patient Active Problem List   Diagnosis Date Noted  . Excess weight 03/06/2018  . Hypertension   . Hypercholesteremia   . Chronic atrial fibrillation   . Overweight(278.02)   . Osteopenia   . Impaired fasting glucose   . DYSPHAGIA 02/03/2009     Past Surgical History:  Procedure Laterality Date  . PARTIAL HYSTERECTOMY        Current Outpatient Medications:  .  albuterol (PROVENTIL HFA;VENTOLIN HFA) 108 (90 Base) MCG/ACT inhaler, Inhale 2 puffs into the lungs every 4 (four) hours as needed for wheezing or shortness of breath (cough, shortness of breath or wheezing.)., Disp: 1 Inhaler, Rfl: 1 .  ALPRAZolam (XANAX) 0.25 MG tablet, TAKE 1 TABLET EVERY DAY AS NEEDED FOR ANXIETY, Disp: , Rfl: 3 .  benzonatate (TESSALON) 100 MG capsule, Take 1-2 capsules (100-200 mg total) by mouth 3 (three) times daily as needed for cough., Disp: 40 capsule, Rfl: 0 .  calcium carbonate (OS-CAL) 600 MG TABS tablet, Take 600 mg by mouth 2 (two) times daily with a meal., Disp: , Rfl:  .  COLCRYS 0.6 MG tablet, Take 0.6 mg by mouth as needed (FOR GOUT). , Disp: , Rfl:  .  diltiazem (CARDIZEM CD) 180 MG 24 hr capsule, TAKE 1 CAPSULE BY MOUTH EVERY DAY, Disp: 90 capsule, Rfl: 3 .  ELIQUIS 5 MG TABS tablet, TAKE 1 TABLET BY MOUTH TWICE A DAY, Disp: 180 tablet, Rfl: 2 .  furosemide (LASIX) 20 MG tablet, Take 20 mg by mouth daily., Disp: , Rfl: 6 .   losartan (COZAAR) 100 MG tablet, Take 100 mg by mouth daily., Disp: , Rfl:  .  Omega-3 Fatty Acids (FISH OIL PO), Take by mouth as directed. , Disp: , Rfl:  .  Red Yeast Rice Extract (RED YEAST RICE PO), Take 1 tablet by mouth daily., Disp: , Rfl:  .  traMADol (ULTRAM) 50 MG tablet, 1 tablet as needed for pain, Disp: , Rfl:  .  potassium chloride SA (KLOR-CON M20) 20 MEQ tablet, Take 1 tablet (20 mEq total) by mouth once for 1 dose., Disp: 90 tablet, Rfl: 3   No Known Allergies   Social History   Occupational History  . Not on file  Tobacco Use  . Smoking status: Never Smoker  . Smokeless tobacco: Never Used  Substance and Sexual Activity  . Alcohol use: No  . Drug use: No  . Sexual activity: Not on file     Family History  Problem Relation Age of Onset  . Hypertension Mother   . Stroke Brother   . Heart attack Neg Hx       There is no immunization history on file for this patient.   Review of systems: Positive Findings in bold print.  Constitutional:  chills, fatigue, fever, sweats, weight change Communication: Nurse, learning disabilitytranslator, sign Presenter, broadcastinglanguage translator, hand writing, iPad/Android device Head: headaches,  head injury Eyes: changes in vision, eye pain, glaucoma, cataracts, macular degeneration, diplopia, glare,  light sensitivity, eyeglasses or contacts, blindness Ears nose mouth throat: Hard of hearing, ringing in ears, deaf, sign language,  vertigo,   nosebleeds,  rhinitis,  cold sores, snoring, swollen glands Cardiovascular: HTN, edema, arrhythmia, pacemaker in place, defibrillator in place,  chest pain/tightness, chronic anticoagulation, blood clot, heart failure Peripheral Vascular: leg cramps, varicose veins, blood clots, lymphedema Respiratory:  difficulty breathing, denies congestion, SOB, wheezing, cough, emphysema Gastrointestinal: change in appetite or weight, abdominal pain, constipation, diarrhea, nausea, vomiting, vomiting blood, change in bowel habits, abdominal pain,  jaundice, rectal bleeding, hemorrhoids, Genitourinary:  nocturia,  pain on urination,  blood in urine, Foley catheter, urinary urgency Musculoskeletal: uses mobility aid,  cramping, stiff joints, painful joints, decreased joint motion, fractures, OA, gout Skin: +changes in toenails, color change, dryness, itching, mole changes,  rash  Neurological: headaches, numbness in feet, paresthesias in feet, burning in feet, fainting,  seizures, change in speech. denies headaches, memory problems/poor historian, cerebral palsy, weakness, paralysis Endocrine: diabetes, hypothyroidism, hyperthyroidism,  goiter, dry mouth, flushing, heat intolerance,  cold intolerance,  excessive thirst, denies polyuria,  nocturia Hematological:  easy bleeding, excessive bleeding, easy bruising, enlarged lymph nodes, on long term blood thinner, history of past transusions Allergy/immunological:  hives, eczema, frequent infections, multiple drug allergies, seasonal allergies, transplant recipient Psychiatric:  anxiety, depression, mood disorder, suicidal ideations, hallucinations   Objective: Vascular Examination: Capillary refill time immediate x 10 digits Dorsalis pedis faintly palpable bilaterally  Posterior tibial pulses present b/l Sparse digital hair x 10 digits Skin temperature gradient WNL b/l  Dermatological Examination: Skin with normal turgor, texture and tone b/l  Toenails 1-5 b/l discolored, thick, dystrophic with subungual debris and pain with palpation to nailbeds due to thickness of nails.  She was noted to have subungual hemorrhage under the nail plate of the right great toe.  This appears to be subacute in nature.  The nail plate remains adhered to the nailbed.  There is no erythema, no edema, no drainage associated with this on today.  Hyperkeratotic lesion submetatarsal head 2 bilaterally and distal aspect of the right fourth toe.  There is no erythema, no edema, no drainage, no flocculence sign  associated.  There are no impending wounds.  Musculoskeletal: Muscle strength 5/5 to all LE muscle groups  Neurological: Sensation intact with 10 gram monofilament Vibratory sensation intact.  Assessment: 1. Painful onychomycosis toenails 1-5 b/l  2. Corns and calluses submetatarsal head 2 bilaterally and right fourth digit 3. NIDDM 4. Patient on chronic anticoagulation with Eliquis   Plan: 1. Discussed onychomycosis and treatment options.   2. Discussed diabetic foot care principles.  Patient to avoid trimming of toenails and corns and calluses due to her being diabetic and on chronic anticoagulation.  Patient related understanding.  Literature dispensed on today. 3. Toenails 1-5 b/l were debrided in length and girth without iatrogenic bleeding. 4. Corns and calluses were pared submetatarsal head 2 bilaterally and right fourth digit without incident. 5. Patient to continue soft, supportive shoe gear 6. Patient to report any pedal injuries to medical professional immediately. 7. Follow up 3 months. Patient/POA to call should there be a concern in the interim.

## 2018-06-05 ENCOUNTER — Ambulatory Visit: Payer: Medicare Other | Admitting: Podiatry

## 2018-06-05 ENCOUNTER — Encounter: Payer: Self-pay | Admitting: Podiatry

## 2018-06-05 ENCOUNTER — Other Ambulatory Visit: Payer: Self-pay

## 2018-06-05 VITALS — Temp 97.5°F

## 2018-06-05 DIAGNOSIS — M79674 Pain in right toe(s): Secondary | ICD-10-CM

## 2018-06-05 DIAGNOSIS — E119 Type 2 diabetes mellitus without complications: Secondary | ICD-10-CM | POA: Diagnosis not present

## 2018-06-05 DIAGNOSIS — L84 Corns and callosities: Secondary | ICD-10-CM | POA: Diagnosis not present

## 2018-06-05 DIAGNOSIS — M79675 Pain in left toe(s): Secondary | ICD-10-CM | POA: Diagnosis not present

## 2018-06-05 DIAGNOSIS — Q828 Other specified congenital malformations of skin: Secondary | ICD-10-CM | POA: Diagnosis not present

## 2018-06-05 DIAGNOSIS — B351 Tinea unguium: Secondary | ICD-10-CM | POA: Diagnosis not present

## 2018-06-06 NOTE — Progress Notes (Signed)
Subjective: Jessica Baxter is a 78 y.o. y.o. female who presents today for preventative diabetic foot care with painful, mycotic toenails and painful porokeratotic lesions b/l which interfere with daily activities. Pain is aggravated when wearing enclosed shoe gear and relieved with periodic professional debridement.  She voices no other pedal complaints on today's visit.  Renford Dills, MD is her PCP and last visit was 02/27/2018.   Current Outpatient Medications:  .  albuterol (PROVENTIL HFA;VENTOLIN HFA) 108 (90 Base) MCG/ACT inhaler, Inhale 2 puffs into the lungs every 4 (four) hours as needed for wheezing or shortness of breath (cough, shortness of breath or wheezing.)., Disp: 1 Inhaler, Rfl: 1 .  ALPRAZolam (XANAX) 0.25 MG tablet, TAKE 1 TABLET EVERY DAY AS NEEDED FOR ANXIETY, Disp: , Rfl: 3 .  benzonatate (TESSALON) 100 MG capsule, Take 1-2 capsules (100-200 mg total) by mouth 3 (three) times daily as needed for cough., Disp: 40 capsule, Rfl: 0 .  calcium carbonate (OS-CAL) 600 MG TABS tablet, Take 600 mg by mouth 2 (two) times daily with a meal., Disp: , Rfl:  .  COLCRYS 0.6 MG tablet, Take 0.6 mg by mouth as needed (FOR GOUT). , Disp: , Rfl:  .  diltiazem (CARDIZEM CD) 180 MG 24 hr capsule, TAKE 1 CAPSULE BY MOUTH EVERY DAY, Disp: 90 capsule, Rfl: 3 .  ELIQUIS 5 MG TABS tablet, TAKE 1 TABLET BY MOUTH TWICE A DAY, Disp: 180 tablet, Rfl: 2 .  furosemide (LASIX) 20 MG tablet, Take 20 mg by mouth daily., Disp: , Rfl: 6 .  losartan (COZAAR) 100 MG tablet, Take 100 mg by mouth daily., Disp: , Rfl:  .  Omega-3 Fatty Acids (FISH OIL PO), Take by mouth as directed. , Disp: , Rfl:  .  potassium chloride SA (KLOR-CON M20) 20 MEQ tablet, Take 1 tablet (20 mEq total) by mouth once for 1 dose., Disp: 90 tablet, Rfl: 3 .  Red Yeast Rice Extract (RED YEAST RICE PO), Take 1 tablet by mouth daily., Disp: , Rfl:  .  traMADol (ULTRAM) 50 MG tablet, 1 tablet as needed for pain, Disp: , Rfl:   No Known  Allergies  Objective:  Vascular Examination: Capillary refill time immediate x 10 digits.  Dorsalis pedis pulses palpable b/l.  Posterior tibial pulses palpable b/l.  Digital hair sparse x 10 digits.  Skin temperature gradient WNL b/l.  Dermatological Examination: Skin with normal turgor, texture and tone b/l.  Toenails 1-5 b/l discolored, thick, dystrophic with subungual debris and pain with palpation to nailbeds due to thickness of nails.  Porokeratotic lesions submetatarsal heads 2 b/l and distal tip right 4th toe with tenderness to palpation. No erythema, no edema, no drainage, no flocculence.   Musculoskeletal: Muscle strength 5/5 to all LE muscle groups  Neurological: Sensation intact with 10 gram monofilament.  Vibratory sensation intact b/l.  Assessment: 1. Painful onychomycosis toenails 1-5 b/l 2. Porokeratoses  submetatarsal heads 2 b/l  3.  Corn distal tip right 4th digit 4.  NIDDM  Plan: 1. Continue diabetic foot care principles. Literature dispensed on today. 2. Toenails 1-5 b/l were debrided in length and girth without iatrogenic bleeding. 3. Porokeratosis submetatarsal heads 2 b/l pared and enucleated with sterile scalpel blade without incident.  4. Corn distal tip right 4th digit pared with sterile scalpel blade without incident.  5. Patient to continue soft, supportive shoe gear daily. 6. Patient to report any pedal injuries to medical professional immediately. 7. Follow up 3 months.  8. Patient/POA to call  should there be a concern in the interim.

## 2018-09-22 ENCOUNTER — Ambulatory Visit: Payer: Medicare Other | Admitting: Podiatry

## 2018-09-22 ENCOUNTER — Other Ambulatory Visit: Payer: Self-pay

## 2018-09-22 ENCOUNTER — Encounter: Payer: Self-pay | Admitting: Podiatry

## 2018-09-22 DIAGNOSIS — M79675 Pain in left toe(s): Secondary | ICD-10-CM | POA: Diagnosis not present

## 2018-09-22 DIAGNOSIS — M79674 Pain in right toe(s): Secondary | ICD-10-CM | POA: Diagnosis not present

## 2018-09-22 DIAGNOSIS — E119 Type 2 diabetes mellitus without complications: Secondary | ICD-10-CM

## 2018-09-22 DIAGNOSIS — Q828 Other specified congenital malformations of skin: Secondary | ICD-10-CM

## 2018-09-22 DIAGNOSIS — B351 Tinea unguium: Secondary | ICD-10-CM | POA: Diagnosis not present

## 2018-09-22 NOTE — Progress Notes (Signed)
Subjective: Jessica Baxter is a 78 y.o. y.o. female who presents today for preventative diabetic foot care with cc of painful, discolored, thick toenails and painful porokeratoses and corn which interfere with daily activities. Pain is aggravated when wearing enclosed shoe gear and relieved with periodic professional debridement.  [name], MD is his PCP.   She voices no new pedal concerns on today's visit.   Current Outpatient Medications:  .  albuterol (PROVENTIL HFA;VENTOLIN HFA) 108 (90 Base) MCG/ACT inhaler, Inhale 2 puffs into the lungs every 4 (four) hours as needed for wheezing or shortness of breath (cough, shortness of breath or wheezing.)., Disp: 1 Inhaler, Rfl: 1 .  ALPRAZolam (XANAX) 0.25 MG tablet, TAKE 1 TABLET EVERY DAY AS NEEDED FOR ANXIETY, Disp: , Rfl: 3 .  benzonatate (TESSALON) 100 MG capsule, Take 1-2 capsules (100-200 mg total) by mouth 3 (three) times daily as needed for cough., Disp: 40 capsule, Rfl: 0 .  calcium carbonate (OS-CAL) 600 MG TABS tablet, Take 600 mg by mouth 2 (two) times daily with a meal., Disp: , Rfl:  .  COLCRYS 0.6 MG tablet, Take 0.6 mg by mouth as needed (FOR GOUT). , Disp: , Rfl:  .  diltiazem (CARDIZEM CD) 180 MG 24 hr capsule, TAKE 1 CAPSULE BY MOUTH EVERY DAY, Disp: 90 capsule, Rfl: 3 .  ELIQUIS 5 MG TABS tablet, TAKE 1 TABLET BY MOUTH TWICE A DAY, Disp: 180 tablet, Rfl: 2 .  ezetimibe (ZETIA) 10 MG tablet, Take 10 mg by mouth daily., Disp: , Rfl:  .  furosemide (LASIX) 20 MG tablet, Take 20 mg by mouth daily., Disp: , Rfl: 6 .  losartan (COZAAR) 100 MG tablet, Take 100 mg by mouth daily., Disp: , Rfl:  .  Omega-3 Fatty Acids (FISH OIL PO), Take by mouth as directed. , Disp: , Rfl:  .  potassium chloride SA (KLOR-CON M20) 20 MEQ tablet, Take 1 tablet (20 mEq total) by mouth once for 1 dose., Disp: 90 tablet, Rfl: 3 .  predniSONE (DELTASONE) 10 MG tablet, TAKE 3 TABLETS BY MOUTH EVERY DAY FOR 4 DAYS, Disp: , Rfl:  .  Red Yeast Rice  Extract (RED YEAST RICE PO), Take 1 tablet by mouth daily., Disp: , Rfl:  .  traMADol (ULTRAM) 50 MG tablet, 1 tablet as needed for pain, Disp: , Rfl:   No Known Allergies  Objective: Vascular Examination: Capillary refill time immediate x 10 digits.  Dorsalis pedis pulses palpable b/l.  Posterior tibial pulses palpable b/l.  Digital hair sparse x 10 digits.  Skin temperature gradient WNL b/l.  Dermatological Examination: Skin with normal turgor, texture and tone b/l.  Toenails 1-5 b/l discolored, thick, dystrophic with subungual debris and pain with palpation to nailbeds due to thickness of nails.  Porokeratotic lesions submet head 2 b/l and distal right 4th digit with tenderness to palpation. No erythema, no edema, no drainage, no flocculence.   Musculoskeletal: Muscle strength 5/5 to all LE muscle groups  Neurological: Sensation intact 5/5 b/l with 10 gram monofilament.  Vibratory sensation intact b/l.  Assessment: 1. Painful onychomycosis toenails 1-5 b/l 2.  Porokeratoses submet head 2 b/l and distal tip right 4th digit 3.  NIDDM  Plan: 1. Continue diabetic foot care principles. Literature dispensed on today. 2. Toenails 1-5 b/l were debrided in length and girth without iatrogenic bleeding. 3. Porokeratosis submet head 2 b/l and distal tip right 4th digit 4.  pared and enucleated with sterile scalpel blade without incident.  5. Patient to  continue soft, supportive shoe gear daily. 6. Patient to report any pedal injuries to medical professional immediately. 7. Follow up 3 months.  8. Patient/POA to call should there be a concern in the interim.

## 2018-09-22 NOTE — Patient Instructions (Signed)

## 2018-10-19 NOTE — Progress Notes (Signed)
Cardiology Office Note Date:  10/31/2018  Patient ID:  Jessica Baxter, Jessica Baxter 25-Feb-1941, MRN [id number] PCP:  [name], MD  Cardiologist: Dr. Johnsie Cancel   Chief Complaint:  F/u afib   History of Present Illness: Britaney Baxter is a 78 y.o. female with history of chronic atrial fib, HTN, DM (diet-controlled), HLD, intolerance of BB due to fatigue  Per chart there have been prior discussions regarding rate control, anticoag and cardioversion and she preferred conservative approach. She has been managed with a strategy of rate control + Eliquis. 2D echo 04/2013: mild LVH, EF 60-65%, indeterminant diastolic fcn due to AF, no RWMA, mod dilated LA.  She is generally unaware of her atrial fibrillation. No chest pain, palpitations, SOB, syncope or bleeding   She is retired and more sedentary Weight is up t and has increased edema  Discussed low sodium diet, she will not wear compression stockings does not improvement with leg elevation and complains about lasix making her urinate all the time   Past Medical History:  Diagnosis Date  . A-fib ([place])   . Chronic atrial fibrillation   . Diabetes mellitus ([place])   . Essential hypertension   . Hypercholesteremia   . Obesity   . Osteopenia     Past Surgical History:  Procedure Laterality Date  . PARTIAL HYSTERECTOMY      Current Outpatient Medications  Medication Sig Dispense Refill  . albuterol (PROVENTIL HFA;VENTOLIN HFA) 108 (90 Base) MCG/ACT inhaler Inhale 2 puffs into the lungs every 4 (four) hours as needed for wheezing or shortness of breath (cough, shortness of breath or wheezing.). 1 Inhaler 1  . ALPRAZolam (XANAX) 0.25 MG tablet TAKE 1 TABLET EVERY DAY AS NEEDED FOR ANXIETY  3  . benzonatate (TESSALON) 100 MG capsule Take 1-2 capsules (100-200 mg total) by mouth 3 (three) times daily as needed for cough. 40 capsule 0  . calcium carbonate (OS-CAL) 600 MG TABS tablet Take 600 mg by mouth 2 (two) times daily with a meal.    . COLCRYS  0.6 MG tablet Take 0.6 mg by mouth as needed (FOR GOUT).     Marland Kitchen diltiazem (CARDIZEM CD) 180 MG 24 hr capsule TAKE 1 CAPSULE BY MOUTH EVERY DAY 90 capsule 3  . ELIQUIS 5 MG TABS tablet TAKE 1 TABLET BY MOUTH TWICE A DAY 180 tablet 2  . ezetimibe (ZETIA) 10 MG tablet Take 10 mg by mouth daily.    . furosemide (LASIX) 20 MG tablet Take 20 mg by mouth daily.  6  . losartan (COZAAR) 100 MG tablet Take 100 mg by mouth daily.    . Omega-3 Fatty Acids (FISH OIL PO) Take by mouth as directed.     . predniSONE (DELTASONE) 10 MG tablet TAKE 3 TABLETS BY MOUTH EVERY DAY FOR 4 DAYS    . Red Yeast Rice Extract (RED YEAST RICE PO) Take 1 tablet by mouth daily.    . traMADol (ULTRAM) 50 MG tablet 1 tablet as needed for pain    . potassium chloride SA (KLOR-CON M20) 20 MEQ tablet Take 1 tablet (20 mEq total) by mouth once for 1 dose. 90 tablet 3   No current facility-administered medications for this visit.     Allergies:   Patient has no known allergies.   Social History:  The patient  reports that she has never smoked. She has never used smokeless tobacco. She reports that she does not drink alcohol or use drugs.   Family History:  The patient's family  history includes Hypertension in her mother; Stroke in her brother.  ROS:  Please see the history of present illness. Trace ankle edema when swinging or hanging legs that resolves when elevating legs. All other systems are reviewed and otherwise negative.   PHYSICAL EXAM:  VS:  BP (!) 162/70   Pulse 89   Ht 5' 2.5" (1.588 m)   Wt 195 lb (88.5 kg)   SpO2 98%   BMI 35.10 kg/m  BMI: Body mass index is 35.1 kg/m.    Affect appropriate Healthy:  appears stated age HEENT: normal Neck supple with no adenopathy JVP normal no bruits no thyromegaly Lungs clear with no wheezing and good diaphragmatic motion Heart:  S1/S2 no murmur, no rub, gallop or click PMI normal Abdomen: benighn, BS positve, no tenderness, no AAA no bruit.  No HSM or HJR Distal  pulses intact with no bruits Plus 2 LE edema some appears to be lymph edema  Neuro non-focal Skin warm and dry No muscular weakness    EKG:  10/31/18 afib rate 89 nonspecific ST changes    CrCl cannot be calculated (Patient's most recent lab result is older than the maximum 21 days allowed.).   Wt Readings from Last 3 Encounters:  10/31/18 195 lb (88.5 kg)  10/03/17 199 lb 12 oz (90.6 kg)  09/27/16 190 lb (86.2 kg)     Other studies reviewed: Additional studies/records reviewed today include: summarized above  ASSESSMENT AND PLAN:  1. Chronic atrial fib - good rate control and anticoagulation  2. Essential HTN - Well controlled.  Continue current medications and low sodium Dash type diet.   3. HLD - lipids are being followed by PCP.Using red yeast rice  4. Obesity - the patient plans to utilize the gym membership she has in the new year. I congratulated her on this decision. 5. DM-2  Diet controlled continue portion control and low carbs A1c with primary  6.   Edema:  Dependant from diet and weight gain continue lasix in afternoon  She does not want to take more or bid as it makes her urinate too much  Disposition: F/u with me in a year   Current medicines are reviewed at length with the patient today.  The patient did not have any concerns regarding medicines.  Jessica Baxter

## 2018-10-31 ENCOUNTER — Other Ambulatory Visit: Payer: Self-pay

## 2018-10-31 ENCOUNTER — Ambulatory Visit (INDEPENDENT_AMBULATORY_CARE_PROVIDER_SITE_OTHER): Payer: Medicare Other | Admitting: Cardiovascular Disease

## 2018-10-31 ENCOUNTER — Encounter: Payer: Self-pay | Admitting: Cardiovascular Disease

## 2018-10-31 VITALS — BP 162/70 | HR 89 | Ht 62.5 in | Wt 195.0 lb

## 2018-10-31 DIAGNOSIS — R609 Edema, unspecified: Secondary | ICD-10-CM

## 2018-10-31 DIAGNOSIS — I482 Chronic atrial fibrillation, unspecified: Secondary | ICD-10-CM

## 2018-10-31 NOTE — Patient Instructions (Signed)
Your physician recommends that you continue on your current medications as directed. Please refer to the Current Medication list given to you today.   Your physician wants you to follow-up in:  6 MONTHS WITH DR NISHAN  You will receive a reminder letter in the mail two months in advance. If you don't receive a letter, please call our office to schedule the follow-up appointment. 

## 2018-11-07 ENCOUNTER — Other Ambulatory Visit: Payer: Self-pay | Admitting: Cardiovascular Disease

## 2018-11-07 NOTE — Telephone Encounter (Signed)
Age 78, weight 88.5kg, SCr 1.19 on 09/18/18 at [facility], last OV 10/2018, afib indication

## 2018-11-19 ENCOUNTER — Other Ambulatory Visit: Payer: Self-pay | Admitting: Cardiovascular Disease

## 2018-12-04 ENCOUNTER — Other Ambulatory Visit: Payer: Self-pay | Admitting: Cardiovascular Disease

## 2018-12-25 ENCOUNTER — Encounter: Payer: Self-pay | Admitting: Podiatry

## 2018-12-25 ENCOUNTER — Other Ambulatory Visit: Payer: Self-pay

## 2018-12-25 ENCOUNTER — Ambulatory Visit: Payer: Medicare Other | Admitting: Podiatry

## 2018-12-25 ENCOUNTER — Other Ambulatory Visit: Payer: Self-pay | Admitting: Internal Medicine

## 2018-12-25 DIAGNOSIS — Z9229 Personal history of other drug therapy: Secondary | ICD-10-CM

## 2018-12-25 DIAGNOSIS — B351 Tinea unguium: Secondary | ICD-10-CM | POA: Diagnosis not present

## 2018-12-25 DIAGNOSIS — M79675 Pain in left toe(s): Secondary | ICD-10-CM | POA: Diagnosis not present

## 2018-12-25 DIAGNOSIS — Z1231 Encounter for screening mammogram for malignant neoplasm of breast: Secondary | ICD-10-CM

## 2018-12-25 DIAGNOSIS — M79674 Pain in right toe(s): Secondary | ICD-10-CM | POA: Diagnosis not present

## 2018-12-25 DIAGNOSIS — E119 Type 2 diabetes mellitus without complications: Secondary | ICD-10-CM | POA: Diagnosis not present

## 2018-12-25 DIAGNOSIS — Q828 Other specified congenital malformations of skin: Secondary | ICD-10-CM

## 2018-12-25 NOTE — Patient Instructions (Signed)
Diabetes Mellitus and Foot Care Foot care is an important part of your health, especially when you have diabetes. Diabetes may cause you to have problems because of poor blood flow (circulation) to your feet and legs, which can cause your skin to:  Become thinner and drier.  Break more easily.  Heal more slowly.  Peel and crack. You may also have nerve damage (neuropathy) in your legs and feet, causing decreased feeling in them. This means that you may not notice minor injuries to your feet that could lead to more serious problems. Noticing and addressing any potential problems early is the best way to prevent future foot problems. How to care for your feet Foot hygiene  Wash your feet daily with warm water and mild soap. Do not use hot water. Then, pat your feet and the areas between your toes until they are completely dry. Do not soak your feet as this can dry your skin.  Trim your toenails straight across. Do not dig under them or around the cuticle. File the edges of your nails with an emery board or nail file.  Apply a moisturizing lotion or petroleum jelly to the skin on your feet and to dry, brittle toenails. Use lotion that does not contain alcohol and is unscented. Do not apply lotion between your toes. Shoes and socks  Wear clean socks or stockings every day. Make sure they are not too tight. Do not wear knee-high stockings since they may decrease blood flow to your legs.  Wear shoes that fit properly and have enough cushioning. Always look in your shoes before you put them on to be sure there are no objects inside.  To break in new shoes, wear them for just a few hours a day. This prevents injuries on your feet. Wounds, scrapes, corns, and calluses  Check your feet daily for blisters, cuts, bruises, sores, and redness. If you cannot see the bottom of your feet, use a mirror or ask someone for help.  Do not cut corns or calluses or try to remove them with medicine.  If you  find a minor scrape, cut, or break in the skin on your feet, keep it and the skin around it clean and dry. You may clean these areas with mild soap and water. Do not clean the area with peroxide, alcohol, or iodine.  If you have a wound, scrape, corn, or callus on your foot, look at it several times a day to make sure it is healing and not infected. Check for: ? Redness, swelling, or pain. ? Fluid or blood. ? Warmth. ? Pus or a bad smell. General instructions  Do not cross your legs. This may decrease blood flow to your feet.  Do not use heating pads or hot water bottles on your feet. They may burn your skin. If you have lost feeling in your feet or legs, you may not know this is happening until it is too late.  Protect your feet from hot and cold by wearing shoes, such as at the beach or on hot pavement.  Schedule a complete foot exam at least once a year (annually) or more often if you have foot problems. If you have foot problems, report any cuts, sores, or bruises to your health care provider immediately. Contact a health care provider if:  You have a medical condition that increases your risk of infection and you have any cuts, sores, or bruises on your feet.  You have an injury that is not   healing.  You have redness on your legs or feet.  You feel burning or tingling in your legs or feet.  You have pain or cramps in your legs and feet.  Your legs or feet are numb.  Your feet always feel cold.  You have pain around a toenail. Get help right away if:  You have a wound, scrape, corn, or callus on your foot and: ? You have pain, swelling, or redness that gets worse. ? You have fluid or blood coming from the wound, scrape, corn, or callus. ? Your wound, scrape, corn, or callus feels warm to the touch. ? You have pus or a bad smell coming from the wound, scrape, corn, or callus. ? You have a fever. ? You have a red line going up your leg. Summary  Check your feet every day  for cuts, sores, red spots, swelling, and blisters.  Moisturize feet and legs daily.  Wear shoes that fit properly and have enough cushioning.  If you have foot problems, report any cuts, sores, or bruises to your health care provider immediately.  Schedule a complete foot exam at least once a year (annually) or more often if you have foot problems. This information is not intended to replace advice given to you by your health care provider. Make sure you discuss any questions you have with your health care provider. Document Released: 02/13/2000 Document Revised: 03/30/2017 Document Reviewed: 03/19/2016 Elsevier Patient Education  2020 [employer] Inc[employer]  

## 2018-12-26 NOTE — Progress Notes (Signed)
Subjective: Jessica Baxter is a 78 y.o. y.o. female who is on long term blood thinner Eliquis, presents today with painful, discolored, thick toenails and porokeratotic lesions b/l which interfere with daily activities. Pain is aggravated when wearing enclosed shoe gear. Pain is relieved with periodic professional debridement.  She states her ankles are swollen today. She hasn't taken her Lasix this morning, but will do so when she returns home.  Current Outpatient Medications on File Prior to Visit  Medication Sig Dispense Refill  . albuterol (PROVENTIL HFA;VENTOLIN HFA) 108 (90 Base) MCG/ACT inhaler Inhale 2 puffs into the lungs every 4 (four) hours as needed for wheezing or shortness of breath (cough, shortness of breath or wheezing.). 1 Inhaler 1  . ALPRAZolam (XANAX) 0.25 MG tablet TAKE 1 TABLET EVERY DAY AS NEEDED FOR ANXIETY  3  . benzonatate (TESSALON) 100 MG capsule Take 1-2 capsules (100-200 mg total) by mouth 3 (three) times daily as needed for cough. 40 capsule 0  . calcium carbonate (OS-CAL) 600 MG TABS tablet Take 600 mg by mouth 2 (two) times daily with a meal.    . COLCRYS 0.6 MG tablet Take 0.6 mg by mouth as needed (FOR GOUT).     Marland Kitchen diltiazem (CARDIZEM CD) 180 MG 24 hr capsule TAKE 1 CAPSULE BY MOUTH EVERY DAY 90 capsule 3  . ELIQUIS 5 MG TABS tablet TAKE 1 TABLET BY MOUTH TWICE A DAY 180 tablet 1  . ezetimibe (ZETIA) 10 MG tablet Take 10 mg by mouth daily.    . furosemide (LASIX) 20 MG tablet Take 20 mg by mouth daily.  6  . KLOR-CON M20 20 MEQ tablet TAKE 1 TABLET BY MOUTH EVERY DAY 90 tablet 3  . losartan (COZAAR) 100 MG tablet Take 100 mg by mouth daily.    . Omega-3 Fatty Acids (FISH OIL PO) Take by mouth as directed.     . predniSONE (DELTASONE) 10 MG tablet TAKE 3 TABLETS BY MOUTH EVERY DAY FOR 4 DAYS    . Red Yeast Rice Extract (RED YEAST RICE PO) Take 1 tablet by mouth daily.    . traMADol (ULTRAM) 50 MG tablet 1 tablet as needed for pain     No current  facility-administered medications on file prior to visit.      No Known Allergies   Objective: Vascular Examination: Capillary refill time immediate b/l.  Dorsalis pedis pulses and Posterior tibial pulses palpable b/l.  Digital hair sparse b/l.  Skin temperature gradient WNL b/l.  Dermatological Examination: Skin with normal turgor, texture and tone b/l.  Porokeratotic lesions submet head 2 b/l and distal tip right 4th digit with tenderness to palpation. No erythema, no edema, no drainage, no flocculence.  Toenails 1-5 b/l discolored, thick, dystrophic with subungual debris and pain with palpation to nailbeds due to thickness of nails.  Musculoskeletal: Muscle strength 5/5 to all LE muscle groups.  Neurological: Sensation intact 5/5 b/l with 10 gram monofilament.  Vibratory sensation intact.  Assessment: Painful onychomycosis toenails 1-5 b/l in patient on blood thinner Painful porokeratoses submet head 2 b/l and distal tip right 4th digit NIDDM Pt on long term blood thinner  Plan: 1. Toenails 1-5 b/l were debrided in length and girth without iatrogenic bleeding.  2. Porokeratosis submet head 2 b/l and distal tip right 4th digit pared and enucleated with sterile scalpel blade without incident. 3. Patient to continue soft, supportive shoe gear daily. 4. Patient to report any pedal injuries to medical professional immediately. 5. Avoid self trimming  due to use of blood thinner. 6. Follow up 3 months.  7. Patient/POA to call should there be a concern in the interim.

## 2019-02-14 DIAGNOSIS — M1712 Unilateral primary osteoarthritis, left knee: Secondary | ICD-10-CM | POA: Insufficient documentation

## 2019-02-19 ENCOUNTER — Other Ambulatory Visit: Payer: Self-pay | Admitting: Cardiovascular Disease

## 2019-02-19 NOTE — Telephone Encounter (Signed)
Prescription refill request for Eliquis received.  Last office visit: Jessica Baxter 10/31/2018 Scr: 1.19, 09/18/2018 Age: 78 y.o. Weight: 88.5 kg  Prescription refill sent.

## 2019-03-01 ENCOUNTER — Other Ambulatory Visit: Payer: Self-pay

## 2019-03-01 ENCOUNTER — Ambulatory Visit
Admission: RE | Admit: 2019-03-01 | Discharge: 2019-03-01 | Disposition: A | Discharge status: Home / Self Care | Payer: Medicare Other | Source: Ambulatory Visit | Attending: Internal Medicine | Admitting: Internal Medicine

## 2019-03-01 DIAGNOSIS — Z1231 Encounter for screening mammogram for malignant neoplasm of breast: Secondary | ICD-10-CM

## 2019-04-02 ENCOUNTER — Other Ambulatory Visit: Payer: Self-pay

## 2019-04-02 ENCOUNTER — Encounter: Payer: Self-pay | Admitting: Podiatry

## 2019-04-02 ENCOUNTER — Ambulatory Visit (INDEPENDENT_AMBULATORY_CARE_PROVIDER_SITE_OTHER): Payer: Medicare Other | Admitting: Podiatry

## 2019-04-02 DIAGNOSIS — Q828 Other specified congenital malformations of skin: Secondary | ICD-10-CM | POA: Diagnosis not present

## 2019-04-02 DIAGNOSIS — M79674 Pain in right toe(s): Secondary | ICD-10-CM

## 2019-04-02 DIAGNOSIS — M79675 Pain in left toe(s): Secondary | ICD-10-CM

## 2019-04-02 DIAGNOSIS — E119 Type 2 diabetes mellitus without complications: Secondary | ICD-10-CM

## 2019-04-02 DIAGNOSIS — B351 Tinea unguium: Secondary | ICD-10-CM

## 2019-04-02 NOTE — Patient Instructions (Signed)
Diabetes Mellitus and Foot Care Foot care is an important part of your health, especially when you have diabetes. Diabetes may cause you to have problems because of poor blood flow (circulation) to your feet and legs, which can cause your skin to:  Become thinner and drier.  Break more easily.  Heal more slowly.  Peel and crack. You may also have nerve damage (neuropathy) in your legs and feet, causing decreased feeling in them. This means that you may not notice minor injuries to your feet that could lead to more serious problems. Noticing and addressing any potential problems early is the best way to prevent future foot problems. How to care for your feet Foot hygiene  Wash your feet daily with warm water and mild soap. Do not use hot water. Then, pat your feet and the areas between your toes until they are completely dry. Do not soak your feet as this can dry your skin.  Trim your toenails straight across. Do not dig under them or around the cuticle. File the edges of your nails with an emery board or nail file.  Apply a moisturizing lotion or petroleum jelly to the skin on your feet and to dry, brittle toenails. Use lotion that does not contain alcohol and is unscented. Do not apply lotion between your toes. Shoes and socks  Wear clean socks or stockings every day. Make sure they are not too tight. Do not wear knee-high stockings since they may decrease blood flow to your legs.  Wear shoes that fit properly and have enough cushioning. Always look in your shoes before you put them on to be sure there are no objects inside.  To break in new shoes, wear them for just a few hours a day. This prevents injuries on your feet. Wounds, scrapes, corns, and calluses  Check your feet daily for blisters, cuts, bruises, sores, and redness. If you cannot see the bottom of your feet, use a mirror or ask someone for help.  Do not cut corns or calluses or try to remove them with medicine.  If you  find a minor scrape, cut, or break in the skin on your feet, keep it and the skin around it clean and dry. You may clean these areas with mild soap and water. Do not clean the area with peroxide, alcohol, or iodine.  If you have a wound, scrape, corn, or callus on your foot, look at it several times a day to make sure it is healing and not infected. Check for: ? Redness, swelling, or pain. ? Fluid or blood. ? Warmth. ? Pus or a bad smell. General instructions  Do not cross your legs. This may decrease blood flow to your feet.  Do not use heating pads or hot water bottles on your feet. They may burn your skin. If you have lost feeling in your feet or legs, you may not know this is happening until it is too late.  Protect your feet from hot and cold by wearing shoes, such as at the beach or on hot pavement.  Schedule a complete foot exam at least once a year (annually) or more often if you have foot problems. If you have foot problems, report any cuts, sores, or bruises to your health care provider immediately. Contact a health care provider if:  You have a medical condition that increases your risk of infection and you have any cuts, sores, or bruises on your feet.  You have an injury that is not   healing.  You have redness on your legs or feet.  You feel burning or tingling in your legs or feet.  You have pain or cramps in your legs and feet.  Your legs or feet are numb.  Your feet always feel cold.  You have pain around a toenail. Get help right away if:  You have a wound, scrape, corn, or callus on your foot and: ? You have pain, swelling, or redness that gets worse. ? You have fluid or blood coming from the wound, scrape, corn, or callus. ? Your wound, scrape, corn, or callus feels warm to the touch. ? You have pus or a bad smell coming from the wound, scrape, corn, or callus. ? You have a fever. ? You have a red line going up your leg. Summary  Check your feet every day  for cuts, sores, red spots, swelling, and blisters.  Moisturize feet and legs daily.  Wear shoes that fit properly and have enough cushioning.  If you have foot problems, report any cuts, sores, or bruises to your health care provider immediately.  Schedule a complete foot exam at least once a year (annually) or more often if you have foot problems. This information is not intended to replace advice given to you by your health care provider. Make sure you discuss any questions you have with your health care provider. Document Revised: 11/08/2018 Document Reviewed: 03/19/2016 Elsevier Patient Education  2020 Elsevier Inc.  

## 2019-04-02 NOTE — Progress Notes (Signed)
Subjective: Jessica Baxter presents today for follow up of preventative diabetic foot care and callus(es) b/l feet and painful mycotic toenails b/l that are difficult to trim. Pain interferes with ambulation. Aggravating factors include wearing enclosed shoe gear. Pain is relieved with periodic professional debridement..   No Known Allergies   Objective: There were no vitals filed for this visit.  Vascular Examination:  Capillary refill time to digits immediate b/l, palpable DP pulses b/l, palpable PT pulses b/l, pedal hair sparse b/l and skin temperature gradient within normal limits b/l  Dermatological Examination: Pedal skin with normal turgor, texture and tone bilaterally, no open wounds bilaterally, no interdigital macerations bilaterally, toenails 1-5 b/l elongated, dystrophic, thickened, crumbly with subungual debris and porokeratotic lesion(s) submet head 2 b/l and distal tip right 4th digit. No erythema, no edema, no drainage, no flocculence  Musculoskeletal: Normal muscle strength 5/5 to all lower extremity muscle groups bilaterally, no pain crepitus or joint limitation noted with ROM b/l and adductovarus deformity right 4th digit  Neurological: Protective sensation intact 5/5 intact bilaterally with 10g monofilament b/l and vibratory sensation intact b/l  Assessment: 1. Pain due to onychomycosis of toenails of both feet   2. Porokeratosis   3. Controlled type 2 diabetes mellitus without complication, without long-term current use of insulin (HCC)      Plan: -Continue diabetic foot care principles. Literature dispensed on today.  -Toenails 1-5 b/l were debrided in length and girth without iatrogenic bleeding. -Painful porokeratotic lesions submet head 2 b/l and right 4th digit pared and enucleated with sterile scalpel blade. Light bleeding right foot lesion addressed with Lumicain Hemostatic solution, cleansed with alcohol. Triple antibiotic ointment and band-aid applied. She  may remove on tomorrow. No further treatment required by patient. -Patient to continue soft, supportive shoe gear daily. -Patient to report any pedal injuries to medical professional immediately. -Patient/POA to call should there be question/concern in the interim.  Return in about 3 months (around 06/30/2019) for diabetic nail and callus trim.

## 2019-04-23 NOTE — Progress Notes (Signed)
Cardiology Office Note Date:  04/27/2019  Patient ID:  Jessica Baxter, Jessica Baxter [birth year], MRN [id number] PCP:  [name], MD  Cardiologist: Dr. Johnsie Cancel   Chief Complaint:  F/u afib   History of Present Illness: Jessica Baxter is a 79 y.o. female with history of chronic atrial fib, HTN, DM (diet-controlled), HLD, intolerance of BB due to fatigue  Per chart there have been prior discussions regarding rate control, anticoag and cardioversion and she preferred conservative approach. She has been managed with a strategy of rate control + Eliquis. 2D echo 04/2013: mild LVH, EF 60-65%, indeterminant diastolic fcn due to AF, no RWMA, mod dilated LA.  She is generally unaware of her atrial fibrillation. No chest pain, palpitations, SOB, syncope or bleeding   She is retired and more sedentary Weight is up t and has increased edema  Discussed low sodium diet, she will not wear compression stockings does not improvement with leg elevation and complains about lasix making her urinate all the time   She has had COVID vaccine but still cautious about seeing family   Past Medical History:  Diagnosis Date  . A-fib ([place])   . Chronic atrial fibrillation ([place])   . Diabetes mellitus ([place])   . Essential hypertension   . Hypercholesteremia   . Obesity   . Osteopenia     Past Surgical History:  Procedure Laterality Date  . PARTIAL HYSTERECTOMY      Current Outpatient Medications  Medication Sig Dispense Refill  . albuterol (PROVENTIL HFA;VENTOLIN HFA) 108 (90 Base) MCG/ACT inhaler Inhale 2 puffs into the lungs every 4 (four) hours as needed for wheezing or shortness of breath (cough, shortness of breath or wheezing.). 1 Inhaler 1  . ALPRAZolam (XANAX) 0.25 MG tablet TAKE 1 TABLET EVERY DAY AS NEEDED FOR ANXIETY  3  . COLCRYS 0.6 MG tablet Take 0.6 mg by mouth as needed (FOR GOUT).     Marland Kitchen diltiazem (CARDIZEM CD) 180 MG 24 hr capsule TAKE 1 CAPSULE BY MOUTH EVERY DAY 90 capsule 3  . ELIQUIS 5 MG  TABS tablet TAKE 1 TABLET BY MOUTH TWICE A DAY 180 tablet 1  . ezetimibe (ZETIA) 10 MG tablet Take 10 mg by mouth daily.    . furosemide (LASIX) 20 MG tablet Take 20 mg by mouth daily.  6  . losartan (COZAAR) 100 MG tablet Take 100 mg by mouth daily.    . Omega-3 Fatty Acids (FISH OIL PO) Take by mouth as directed.     . Red Yeast Rice Extract (RED YEAST RICE PO) Take 1 tablet by mouth daily.    . traMADol (ULTRAM) 50 MG tablet 1 tablet as needed for pain     No current facility-administered medications for this visit.    Allergies:   Patient has no known allergies.   Social History:  The patient  reports that she has never smoked. She has never used smokeless tobacco. She reports that she does not drink alcohol or use drugs.   Family History:  The patient's family history includes Hypertension in her mother; Stroke in her brother.  ROS:  Please see the history of present illness. Trace ankle edema when swinging or hanging legs that resolves when elevating legs. All other systems are reviewed and otherwise negative.   PHYSICAL EXAM:  VS:  BP 109/72   Pulse 98   Ht 5\' 2"  (1.575 m)   Wt 194 lb (88 kg)   SpO2 98%   BMI 35.48 kg/m  BMI: Body  mass index is 35.48 kg/m.    Affect appropriate Healthy:  appears stated age HEENT: normal Neck supple with no adenopathy JVP normal no bruits no thyromegaly Lungs clear with no wheezing and good diaphragmatic motion Heart:  S1/S2 no murmur, no rub, gallop or click PMI normal Abdomen: benighn, BS positve, no tenderness, no AAA no bruit.  No HSM or HJR Distal pulses intact with no bruits Plus 2 LE edema some appears to be lymph edema  Neuro non-focal Skin warm and dry No muscular weakness    EKG:  10/31/18 afib rate 89 nonspecific ST changes    CrCl cannot be calculated (Patient's most recent lab result is older than the maximum 21 days allowed.).   Wt Readings from Last 3 Encounters:  04/27/19 194 lb (88 kg)  10/31/18 195 lb (88.5  kg)  10/03/17 199 lb 12 oz (90.6 kg)     Other studies reviewed: Echo 04/25/13   ASSESSMENT AND PLAN:  1. Chronic atrial fib - good rate control and anticoagulation will update echo to make sure EF is remaining normal  2. Essential HTN - Well controlled.  Continue current medications and low sodium Dash type diet.   3. HLD - lipids are being followed by PCP.Using red yeast rice  4. Obesity - the patient plans to utilize the gym membership she has in the new year. I congratulated her on this decision. 5. DM-2  Diet controlled continue portion control and low carbs A1c with primary  6.   Edema:  Dependant from diet and weight gain  ? Lymphedema continue lasix         in afternoon  She does not want to take more or bid as it makes her urinate       too much   Disposition: F/u with me in a year  Tests:  Echo for afib   Current medicines are reviewed at length with the patient today.  The patient did not have any concerns regarding medicines.  Charlton Haws

## 2019-04-27 ENCOUNTER — Ambulatory Visit: Payer: Medicare PPO | Admitting: Cardiovascular Disease

## 2019-04-27 ENCOUNTER — Encounter: Payer: Self-pay | Admitting: Cardiovascular Disease

## 2019-04-27 ENCOUNTER — Other Ambulatory Visit: Payer: Self-pay

## 2019-04-27 VITALS — BP 109/72 | HR 98 | Ht 62.0 in | Wt 194.0 lb

## 2019-04-27 DIAGNOSIS — I482 Chronic atrial fibrillation, unspecified: Secondary | ICD-10-CM

## 2019-04-27 MED ORDER — POTASSIUM CHLORIDE CRYS ER 20 MEQ PO TBCR: 20.0000 meq | EXTENDED_RELEASE_TABLET | Freq: Every day | ORAL | 3 refills | Status: AC

## 2019-04-27 NOTE — Patient Instructions (Addendum)
Medication Instructions:  *If you need a refill on your cardiac medications before your next appointment, please call your pharmacy*  Lab Work: If you have labs (blood work) drawn today and your tests are completely normal, you will receive your results only by: . MyChart Message (if you have MyChart) OR . A paper copy in the mail If you have any lab test that is abnormal or we need to change your treatment, we will call you to review the results.  Testing/Procedures: Your physician has requested that you have an echocardiogram. Echocardiography is a painless test that uses sound waves to create images of your heart. It provides your doctor with information about the size and shape of your heart and how well your heart's chambers and valves are working. This procedure takes approximately one hour. There are no restrictions for this procedure.  Follow-Up: At [facility], you and your health needs are our priority.  As part of our continuing mission to provide you with exceptional heart care, we have created designated Provider Care Teams.  These Care Teams include your primary Cardiologist (physician) and Advanced Practice Providers (APPs -  Physician Assistants and Nurse Practitioners) who all work together to provide you with the care you need, when you need it.  We recommend signing up for the patient portal called "MyChart".  Sign up information is provided on this After Visit Summary.  MyChart is used to connect with patients for Virtual Visits (Telemedicine).  Patients are able to view lab/test results, encounter notes, upcoming appointments, etc.  Non-urgent messages can be sent to your provider as well.   To learn more about what you can do with MyChart, go to [email or web address].    Your next appointment:   12 month(s)  The format for your next appointment:   In Person  Provider:   You may see Dr. Nishan or one of the following Advanced Practice Providers on your designated  Care Team:    Lori Gerhardt, NP  Laura Ingold, NP  Jill McDaniel, NP   

## 2019-05-07 ENCOUNTER — Emergency Department (HOSPITAL_COMMUNITY)
Admission: EM | Admit: 2019-05-07 | Discharge: 2019-05-08 | Disposition: A | Discharge status: Home / Self Care | Payer: Medicare PPO | Attending: Emergency Medicine | Admitting: Emergency Medicine

## 2019-05-07 ENCOUNTER — Encounter (HOSPITAL_COMMUNITY): Payer: Self-pay

## 2019-05-07 ENCOUNTER — Other Ambulatory Visit: Payer: Self-pay

## 2019-05-07 ENCOUNTER — Emergency Department (HOSPITAL_COMMUNITY): Payer: Medicare PPO

## 2019-05-07 DIAGNOSIS — R29898 Other symptoms and signs involving the musculoskeletal system: Secondary | ICD-10-CM | POA: Diagnosis not present

## 2019-05-07 DIAGNOSIS — R791 Abnormal coagulation profile: Secondary | ICD-10-CM | POA: Insufficient documentation

## 2019-05-07 DIAGNOSIS — I10 Essential (primary) hypertension: Secondary | ICD-10-CM | POA: Diagnosis not present

## 2019-05-07 DIAGNOSIS — N289 Disorder of kidney and ureter, unspecified: Secondary | ICD-10-CM | POA: Insufficient documentation

## 2019-05-07 DIAGNOSIS — M25562 Pain in left knee: Secondary | ICD-10-CM | POA: Diagnosis not present

## 2019-05-07 DIAGNOSIS — E119 Type 2 diabetes mellitus without complications: Secondary | ICD-10-CM | POA: Insufficient documentation

## 2019-05-07 LAB — CBC
HCT: 41.3 % (ref 36.0–46.0)
Hemoglobin: 13.3 g/dL (ref 12.0–15.0)
MCH: 28.7 pg (ref 26.0–34.0)
MCHC: 32.2 g/dL (ref 30.0–36.0)
MCV: 89 fL (ref 80.0–100.0)
Platelets: 233 10*3/uL (ref 150–400)
RBC: 4.64 MIL/uL (ref 3.87–5.11)
RDW: 13.1 % (ref 11.5–15.5)
WBC: 10 10*3/uL (ref 4.0–10.5)
nRBC: 0 % (ref 0.0–0.2)

## 2019-05-07 LAB — DIFFERENTIAL
Abs Immature Granulocytes: 0.03 10*3/uL (ref 0.00–0.07)
Basophils Absolute: 0 10*3/uL (ref 0.0–0.1)
Basophils Relative: 0 %
Eosinophils Absolute: 0.1 10*3/uL (ref 0.0–0.5)
Eosinophils Relative: 1 %
Immature Granulocytes: 0 %
Lymphocytes Relative: 32 %
Lymphs Abs: 3.2 10*3/uL (ref 0.7–4.0)
Monocytes Absolute: 0.7 10*3/uL (ref 0.1–1.0)
Monocytes Relative: 7 %
Neutro Abs: 6.1 10*3/uL (ref 1.7–7.7)
Neutrophils Relative %: 60 %

## 2019-05-07 LAB — APTT: aPTT: 35 seconds (ref 24–36)

## 2019-05-07 LAB — PROTIME-INR
INR: 1.3 — ABNORMAL HIGH (ref 0.8–1.2)
Prothrombin Time: 15.8 seconds — ABNORMAL HIGH (ref 11.4–15.2)

## 2019-05-07 MED ORDER — SODIUM CHLORIDE 0.9% FLUSH: 3.0000 mL | Freq: Once | INTRAVENOUS | Status: DC

## 2019-05-07 NOTE — ED Triage Notes (Signed)
Pt reports yesterday while at the grocery store her left leg felt weaker than the other, states she had to use her walker because she felt like her leg was dragging. Pt also has hx of arthritis in her knee, c.o increased pain and swelling recently. No other neuro deficits noted, no left arm weakness, slurred speech or facial droop. Pt a.o

## 2019-05-07 NOTE — ED Provider Notes (Signed)
Emergency Department Provider Note   I have reviewed the triage vital signs and the nursing notes.   HISTORY  Chief Complaint Knee Pain and Weakness   HPI Apoorva Bugay is a 79 y.o. female with PMH reviewed below presents to the emergency department for evaluation of left leg weakness.  Patient states that she "just don't feel right."    Symptoms began yesterday while at the grocery store.  She states that she suddenly seemed to require her cane and felt like she was having difficulty walking.  She does have arthritis in the left leg and lower back which are chronic for her and she initially attributed her symptoms to this.  She has continued to be ambulatory without falls or head injury.  She spoke with her PCP today who referred her to the emergency department for further evaluation of possible stroke.  She denies any numbness, vision change, speech change, dysphagia. No fever or URI symptoms.   Past Medical History:  Diagnosis Date  . A-fib (HCC)   . Chronic atrial fibrillation (HCC)   . Diabetes mellitus (HCC)   . Essential hypertension   . Hypercholesteremia   . Obesity   . Osteopenia     Patient Active Problem List   Diagnosis Date Noted  . Excess weight 03/06/2018  . Hypertension   . Hypercholesteremia   . Chronic atrial fibrillation (HCC)   . Overweight(278.02)   . Osteopenia   . Impaired fasting glucose   . DYSPHAGIA 02/03/2009    Past Surgical History:  Procedure Laterality Date  . PARTIAL HYSTERECTOMY      Allergies Patient has no known allergies.  Family History  Problem Relation Age of Onset  . Hypertension Mother   . Stroke Brother   . Heart attack Neg Hx     Social History Social History   Tobacco Use  . Smoking status: Never Smoker  . Smokeless tobacco: Never Used  Substance Use Topics  . Alcohol use: No  . Drug use: No    Review of Systems  Constitutional: No fever/chills Eyes: No visual changes. ENT: No sore  throat. Cardiovascular: Denies chest pain. Respiratory: Denies shortness of breath. Gastrointestinal: No abdominal pain.  No nausea, no vomiting.  No diarrhea.  No constipation. Genitourinary: Negative for dysuria. Musculoskeletal: Chronic back and knee pain.  Skin: Negative for rash. Neurological: Negative for headaches or numbness. Question left leg weakness and difficulty walking.   10-point ROS otherwise negative.  ____________________________________________   PHYSICAL EXAM:  VITAL SIGNS: ED Triage Vitals [05/07/19 1602]  Enc Vitals Group     BP (!) 186/94     Pulse Rate (!) 102     Resp 17     Temp 99 F (37.2 C)     Temp Source Oral     SpO2 98 %   Constitutional: Alert and oriented. Well appearing and in no acute distress. Eyes: Conjunctivae are normal. PERRL. Head: Atraumatic. Nose: No congestion/rhinnorhea. Mouth/Throat: Mucous membranes are moist.  Neck: No stridor.  Cardiovascular: Normal rate, regular rhythm. Good peripheral circulation. Grossly normal heart sounds.   Respiratory: Normal respiratory effort.  No retractions. Lungs CTAB. Gastrointestinal: Soft and nontender. No distention.  Musculoskeletal: No lower extremity tenderness nor edema. No gross deformities of extremities. Neurologic:  Normal speech and language.  Equal strength in the bilateral upper extremities.  No facial asymmetry or numbness. 4+/5 strength in the LLE compared to the right.  Skin:  Skin is warm, dry and intact. No rash noted.  ____________________________________________   LABS (all labs ordered are listed, but only abnormal results are displayed)  Labs Reviewed  PROTIME-INR - Abnormal; Notable for the following components:      Result Value   Prothrombin Time 15.8 (*)    INR 1.3 (*)    All other components within normal limits  COMPREHENSIVE METABOLIC PANEL - Abnormal; Notable for the following components:   Creatinine, Ser 1.28 (*)    GFR calc non Af Amer 40 (*)     GFR calc Af Amer 46 (*)    All other components within normal limits  APTT  CBC  DIFFERENTIAL   ____________________________________________  EKG   EKG Interpretation  Date/Time:  Monday May 07 2019 16:26:49 EST Ventricular Rate:  96 PR Interval:    QRS Duration: 66 QT Interval:  322 QTC Calculation: 406 R Axis:   81 Text Interpretation: Atrial fibrillation with premature ventricular or aberrantly conducted complexes Nonspecific T wave abnormality Abnormal ECG No STEMI Confirmed by Alona Bene [phone]) on 05/07/2019 11:03:51 PM Also confirmed by Alona Bene [phone]), editor Jac Canavan, [place] ([id number])  on 05/08/2019 8:02:31 AM       ____________________________________________  RADIOLOGY  CT HEAD WO CONTRAST  Result Date: 05/07/2019 CLINICAL DATA:  Focal neurological deficit greater than 6 hours. Possible stroke. Left leg weakness and dragging while grocery shopping yesterday. EXAM: CT HEAD WITHOUT CONTRAST TECHNIQUE: Contiguous axial images were obtained from the base of the skull through the vertex without intravenous contrast. Automatic exposure control utilized. COMPARISON:  None. FINDINGS: Brain: There is no acute intracranial hemorrhage, midline shift, abnormal extra-axial fluid collection. Focal ovoid hypoattenuation in the left thalamus likely represents a subacute lacunar infarction. There is diffuse moderate cerebral atrophy with proportionate ventriculomegaly and no hydrocephalus. There is no vascular territory distribution encephalomalacia or edema. There is CT artifact from volume averaging of the cortex and sulcal spaces in the inferior right frontal lobe. A chronic-appearing 16 by 10 by 7 mm and second 8 by 5 by 6 mm extra-axial lentiform shaped calcifications are present in the left frontal and parietal convexity, respectively. Vascular: No dense vessel sign. Skull: Diffuse mild demineralization.  No skull fracture. Sinuses/Orbits: Mild bilateral ethmoid and maxillary  mucosal thickening. Tiny osteomas in the medial frontal sinuses. Normal orbital soft tissues. Other: Small bilateral parotid lymph node. IMPRESSION: Subacute-appearing left thalamic lacunar bland infarction. MR brain without contrast could provide a more sensitive evaluation for ischemia as clinically appropriate. No acute intracranial hemorrhage. Two extra-axial lentiform-shaped calcifications in the left skull convexity, probably coarsely calcified small meningiomas or osteomas. Electronically Signed   By: Laurence Ferrari   On: 05/07/2019 17:46   MR BRAIN WO CONTRAST  Result Date: 05/08/2019 CLINICAL DATA:  Initial evaluation for acute left lower extremity weakness, possible stroke. EXAM: MRI HEAD WITHOUT CONTRAST TECHNIQUE: Multiplanar, multiecho pulse sequences of the brain and surrounding structures were obtained without intravenous contrast. COMPARISON:  Prior CT from 05/07/2019. FINDINGS: Brain: Cerebral volume within normal limits for age. Few scattered foci of subcentimeter T2/FLAIR hyperintensity noted within the periventricular and deep white matter both cerebral hemispheres, nonspecific, but most like related chronic microvascular ischemic disease, minimal for age. No abnormal foci of restricted diffusion to suggest acute or subacute ischemia. Gray-white matter differentiation maintained. No encephalomalacia to suggest chronic cortical infarction. No foci of susceptibility artifact to suggest acute or chronic intracranial hemorrhage. No mass lesion, midline shift or mass effect. Mild ventricular prominence related to global parenchymal volume loss of hydrocephalus. No extra-axial fluid collection. Pituitary  gland and suprasellar region normal. Midline structures intact. Vascular: Major intracranial vascular flow voids are well maintained. Skull and upper cervical spine: Craniocervical junction normal. Multilevel degenerative spondylosis noted within the upper cervical spine with resultant  mild-to-moderate spinal stenosis at C3-4. Bone marrow signal intensity within normal limits. No scalp soft tissue abnormality. Sinuses/Orbits: Globes and orbital soft tissues demonstrate no acute finding. Probable remote fracture of the left lamina papyracea noted. Paranasal sinuses are clear. No mastoid effusion. Inner ear structures grossly normal. Other: None. IMPRESSION: 1. No acute intracranial abnormality. 2. Mild for age chronic microvascular ischemic disease. 3. Multilevel degenerative spondylosis within the upper cervical spine with resultant mild to moderate spinal stenosis at C3-4. Finding could be further assessed with dedicated MRI of the cervical spine as clinically warranted. Electronically Signed   By: Jeannine Boga M.D.   On: 05/08/2019 01:15    ____________________________________________   PROCEDURES  Procedure(s) performed:   Procedures  None ____________________________________________   INITIAL IMPRESSION / ASSESSMENT AND PLAN / ED COURSE  Pertinent labs & imaging results that were available during my care of the patient were reviewed by me and considered in my medical decision making (see chart for details).   Patient presents to the ED for evaluation possible CVA with left leg weakness. CT head with possible subacute infarct. Will confirm with MRI.   MRI brain pending. Labs reviewed. Care transferred to Dr. Roxanne Mins.  ____________________________________________  FINAL CLINICAL IMPRESSION(S) / ED DIAGNOSES  Final diagnoses:  Left leg weakness  Renal insufficiency    Note:  This document was prepared using Dragon voice recognition software and may include unintentional dictation errors.  Nanda Quinton, MD, [facility] Emergency Medicine    Yarel Rushlow, [facility], MD 05/08/19 [phone]

## 2019-05-08 ENCOUNTER — Other Ambulatory Visit (HOSPITAL_COMMUNITY): Payer: Medicare PPO

## 2019-05-08 ENCOUNTER — Emergency Department (HOSPITAL_COMMUNITY): Payer: Medicare PPO

## 2019-05-08 LAB — COMPREHENSIVE METABOLIC PANEL
ALT: 17 U/L (ref 0–44)
AST: 22 U/L (ref 15–41)
Albumin: 3.9 g/dL (ref 3.5–5.0)
Alkaline Phosphatase: 74 U/L (ref 38–126)
Anion gap: 11 (ref 5–15)
BUN: 19 mg/dL (ref 8–23)
CO2: 22 mmol/L (ref 22–32)
Calcium: 9.2 mg/dL (ref 8.9–10.3)
Chloride: 107 mmol/L (ref 98–111)
Creatinine, Ser: 1.28 mg/dL — ABNORMAL HIGH (ref 0.44–1.00)
GFR calc Af Amer: 46 mL/min — ABNORMAL LOW (ref >60)
GFR calc non Af Amer: 40 mL/min — ABNORMAL LOW (ref >60)
Glucose, Bld: 93 mg/dL (ref 70–99)
Potassium: 3.9 mmol/L (ref 3.5–5.1)
Sodium: 140 mmol/L (ref 135–145)
Total Bilirubin: 0.7 mg/dL (ref 0.3–1.2)
Total Protein: 7.4 g/dL (ref 6.5–8.1)

## 2019-05-08 NOTE — ED Notes (Signed)
Pt to MRI via cart. Pt conscious, breathing and A&Ox4.

## 2019-05-08 NOTE — ED Notes (Signed)
Pt returned from MRI via cart. Pt conscious, breathing, and A&Ox4.

## 2019-05-08 NOTE — ED Provider Notes (Signed)
Care assumed from Dr. Jacqulyn Bath, patient being evaluated for left leg weakness and question of stroke.  MRI of the brain was obtained showing no evidence of stroke.  Weight leg weakness is felt to be related to arthritis.  She is referred back to her primary care provider.   Dione Booze, MD 05/08/19 [phone]

## 2019-05-08 NOTE — Discharge Instructions (Addendum)
Your evaluation did not show any sign of a stroke. Please follow up with your primary care provider for further evaluation and treatment.

## 2019-05-15 ENCOUNTER — Other Ambulatory Visit: Payer: Self-pay

## 2019-05-15 ENCOUNTER — Ambulatory Visit (HOSPITAL_COMMUNITY): Payer: Medicare PPO | Attending: Cardiovascular Disease

## 2019-05-15 DIAGNOSIS — I482 Chronic atrial fibrillation, unspecified: Secondary | ICD-10-CM | POA: Diagnosis not present

## 2019-07-03 ENCOUNTER — Encounter: Payer: Self-pay | Admitting: Podiatry

## 2019-07-03 ENCOUNTER — Other Ambulatory Visit: Payer: Self-pay

## 2019-07-03 ENCOUNTER — Ambulatory Visit: Payer: Medicare PPO | Admitting: Podiatry

## 2019-07-03 VITALS — Temp 97.2°F

## 2019-07-03 DIAGNOSIS — Q828 Other specified congenital malformations of skin: Secondary | ICD-10-CM | POA: Diagnosis not present

## 2019-07-03 DIAGNOSIS — E119 Type 2 diabetes mellitus without complications: Secondary | ICD-10-CM

## 2019-07-03 NOTE — Patient Instructions (Signed)
Diabetes Mellitus and Foot Care Foot care is an important part of your health, especially when you have diabetes. Diabetes may cause you to have problems because of poor blood flow (circulation) to your feet and legs, which can cause your skin to:  Become thinner and drier.  Break more easily.  Heal more slowly.  Peel and crack. You may also have nerve damage (neuropathy) in your legs and feet, causing decreased feeling in them. This means that you may not notice minor injuries to your feet that could lead to more serious problems. Noticing and addressing any potential problems early is the best way to prevent future foot problems. How to care for your feet Foot hygiene  Wash your feet daily with warm water and mild soap. Do not use hot water. Then, pat your feet and the areas between your toes until they are completely dry. Do not soak your feet as this can dry your skin.  Trim your toenails straight across. Do not dig under them or around the cuticle. File the edges of your nails with an emery board or nail file.  Apply a moisturizing lotion or petroleum jelly to the skin on your feet and to dry, brittle toenails. Use lotion that does not contain alcohol and is unscented. Do not apply lotion between your toes. Shoes and socks  Wear clean socks or stockings every day. Make sure they are not too tight. Do not wear knee-high stockings since they may decrease blood flow to your legs.  Wear shoes that fit properly and have enough cushioning. Always look in your shoes before you put them on to be sure there are no objects inside.  To break in new shoes, wear them for just a few hours a day. This prevents injuries on your feet. Wounds, scrapes, corns, and calluses  Check your feet daily for blisters, cuts, bruises, sores, and redness. If you cannot see the bottom of your feet, use a mirror or ask someone for help.  Do not cut corns or calluses or try to remove them with medicine.  If you  find a minor scrape, cut, or break in the skin on your feet, keep it and the skin around it clean and dry. You may clean these areas with mild soap and water. Do not clean the area with peroxide, alcohol, or iodine.  If you have a wound, scrape, corn, or callus on your foot, look at it several times a day to make sure it is healing and not infected. Check for: ? Redness, swelling, or pain. ? Fluid or blood. ? Warmth. ? Pus or a bad smell. General instructions  Do not cross your legs. This may decrease blood flow to your feet.  Do not use heating pads or hot water bottles on your feet. They may burn your skin. If you have lost feeling in your feet or legs, you may not know this is happening until it is too late.  Protect your feet from hot and cold by wearing shoes, such as at the beach or on hot pavement.  Schedule a complete foot exam at least once a year (annually) or more often if you have foot problems. If you have foot problems, report any cuts, sores, or bruises to your health care provider immediately. Contact a health care provider if:  You have a medical condition that increases your risk of infection and you have any cuts, sores, or bruises on your feet.  You have an injury that is not   healing.  You have redness on your legs or feet.  You feel burning or tingling in your legs or feet.  You have pain or cramps in your legs and feet.  Your legs or feet are numb.  Your feet always feel cold.  You have pain around a toenail. Get help right away if:  You have a wound, scrape, corn, or callus on your foot and: ? You have pain, swelling, or redness that gets worse. ? You have fluid or blood coming from the wound, scrape, corn, or callus. ? Your wound, scrape, corn, or callus feels warm to the touch. ? You have pus or a bad smell coming from the wound, scrape, corn, or callus. ? You have a fever. ? You have a red line going up your leg. Summary  Check your feet every day  for cuts, sores, red spots, swelling, and blisters.  Moisturize feet and legs daily.  Wear shoes that fit properly and have enough cushioning.  If you have foot problems, report any cuts, sores, or bruises to your health care provider immediately.  Schedule a complete foot exam at least once a year (annually) or more often if you have foot problems. This information is not intended to replace advice given to you by your health care provider. Make sure you discuss any questions you have with your health care provider. Document Revised: 11/08/2018 Document Reviewed: 03/19/2016 Elsevier Patient Education  2020 [employer]  Corns and Calluses Corns are small areas of thickened skin that occur on the top, sides, or tip of a toe. They contain a cone-shaped core with a point that can press on a nerve below. This causes pain.  Calluses are areas of thickened skin that can occur anywhere on the body, including the hands, fingers, palms, soles of the feet, and heels. Calluses are usually larger than corns. What are the causes? Corns and calluses are caused by rubbing (friction) or pressure, such as from shoes that are too tight or do not fit properly. What increases the risk? Corns are more likely to develop in people who have misshapen toes (toe deformities), such as hammer toes. Calluses can occur with friction to any area of the skin. They are more likely to develop in people who:  Work with their hands.  Wear shoes that fit poorly, are too tight, or are high-heeled.  Have toe deformities. What are the signs or symptoms? Symptoms of a corn or callus include:  A hard growth on the skin.  Pain or tenderness under the skin.  Redness and swelling.  Increased discomfort while wearing tight-fitting shoes, if your feet are affected. If a corn or callus becomes infected, symptoms may include:  Redness and swelling that gets worse.  Pain.  Fluid, blood, or pus draining from the corn or  callus. How is this diagnosed? Corns and calluses may be diagnosed based on your symptoms, your medical history, and a physical exam. How is this treated? Treatment for corns and calluses may include:  Removing the cause of the friction or pressure. This may involve: ? Changing your shoes. ? Wearing shoe inserts (orthotics) or other protective layers in your shoes, such as a corn pad. ? Wearing gloves.  Applying medicine to the skin (topical medicine) to help soften skin in the hardened, thickened areas.  Removing layers of dead skin with a file to reduce the size of the corn or callus.  Removing the corn or callus with a scalpel or laser.  Taking   antibiotic medicines, if your corn or callus is infected.  Having surgery, if a toe deformity is the cause. Follow these instructions at home:   Take over-the-counter and prescription medicines only as told by your health care provider.  If you were prescribed an antibiotic, take it as told by your health care provider. Do not stop taking it even if your condition starts to improve.  Wear shoes that fit well. Avoid wearing high-heeled shoes and shoes that are too tight or too loose.  Wear any padding, protective layers, gloves, or orthotics as told by your health care provider.  Soak your hands or feet and then use a file or pumice stone to soften your corn or callus. Do this as told by your health care provider.  Check your corn or callus every day for symptoms of infection. Contact a health care provider if you:  Notice that your symptoms do not improve with treatment.  Have redness or swelling that gets worse.  Notice that your corn or callus becomes painful.  Have fluid, blood, or pus coming from your corn or callus.  Have new symptoms. Summary  Corns are small areas of thickened skin that occur on the top, sides, or tip of a toe.  Calluses are areas of thickened skin that can occur anywhere on the body, including the  hands, fingers, palms, and soles of the feet. Calluses are usually larger than corns.  Corns and calluses are caused by rubbing (friction) or pressure, such as from shoes that are too tight or do not fit properly.  Treatment may include wearing any padding, protective layers, gloves, or orthotics as told by your health care provider. This information is not intended to replace advice given to you by your health care provider. Make sure you discuss any questions you have with your health care provider. Document Revised: 06/07/2018 Document Reviewed: 12/29/2016 Elsevier Patient Education  2020 Elsevier Inc.  

## 2019-07-05 NOTE — Progress Notes (Signed)
Subjective: Jessica Baxter is a 79 y.o. female patient seen today preventative diabetic foot care and callus(es) b/l feet. Aggravating factors include weightbearing with and without shoe gear. Pain is relieved with periodic professional debridement.   She has h/o lymphedema b/l LE. Followed by Cardiology. She voices nonew pedal problems on today's visit.  Patient Active Problem List   Diagnosis Date Noted  . Excess weight 03/06/2018  . Hypertension   . Hypercholesteremia   . Chronic atrial fibrillation (HCC)   . Overweight(278.02)   . Osteopenia   . Impaired fasting glucose   . DYSPHAGIA 02/03/2009    Current Outpatient Medications on File Prior to Visit  Medication Sig Dispense Refill  . albuterol (PROVENTIL HFA;VENTOLIN HFA) 108 (90 Base) MCG/ACT inhaler Inhale 2 puffs into the lungs every 4 (four) hours as needed for wheezing or shortness of breath (cough, shortness of breath or wheezing.). 1 Inhaler 1  . ALPRAZolam (XANAX) 0.25 MG tablet Take 0.25 mg by mouth daily as needed for anxiety.   3  . COLCRYS 0.6 MG tablet Take 0.6 mg by mouth as needed (FOR GOUT).     Marland Kitchen diltiazem (CARDIZEM CD) 180 MG 24 hr capsule TAKE 1 CAPSULE BY MOUTH EVERY DAY (Patient taking differently: Take 180 mg by mouth daily. ) 90 capsule 3  . ELIQUIS 5 MG TABS tablet TAKE 1 TABLET BY MOUTH TWICE A DAY (Patient taking differently: Take 5 mg by mouth 2 (two) times daily. ) 180 tablet 1  . ezetimibe (ZETIA) 10 MG tablet Take 10 mg by mouth daily.    . furosemide (LASIX) 20 MG tablet Take 20 mg by mouth daily.  6  . losartan (COZAAR) 100 MG tablet Take 100 mg by mouth daily.    . Omega-3 Fatty Acids (FISH OIL PO) Take by mouth as directed.     . potassium chloride SA (KLOR-CON M20) 20 MEQ tablet Take 1 tablet (20 mEq total) by mouth daily. 90 tablet 3  . Red Yeast Rice Extract (RED YEAST RICE PO) Take 1 tablet by mouth daily.     No current facility-administered medications on file prior to visit.    No  Known Allergies  Objective: Physical Exam  General: 79 y.o. AAF in NAD. AAO x 3.   Vascular:  Capillary refill time to digits immediate b/l. Palpable DP pulses b/l. Palpable PT pulses b/l. Pedal hair sparse b/l. Skin temperature gradient within normal limits b/l. Lymphedema present b/l LE.  Dermatological:  Pedal skin with normal turgor, texture and tone bilaterally. No open wounds bilaterally. No interdigital macerations bilaterally. Toenails 1-5 b/l elongated, dystrophic, thickened, crumbly with subungual debris and tenderness to dorsal palpation. Porokeratotic lesion(s) R 4th toe, submet head 2 left foot and submet head 2 right foot. No erythema, no edema, no drainage, no flocculence.  Musculoskeletal:  Normal muscle strength 5/5 to all lower extremity muscle groups bilaterally. No pain crepitus or joint limitation noted with ROM b/l. Hammertoes noted to the R 4th toe.  Neurological:  Protective sensation intact 5/5 intact bilaterally with 10g monofilament b/l. Vibratory sensation intact b/l. Proprioception intact bilaterally.  Assessment and Plan:  Problem List Items Addressed This Visit    None    Visit Diagnoses    Porokeratosis    -  Primary   Controlled type 2 diabetes mellitus without complication, without long-term current use of insulin (HCC)        -Examined patient.  -No new findings. No new orders. -Continue diabetic foot care principles. Literature  dispensed on today.  -Toenails 1-5 b/l were debrided in length and girth with sterile nail nippers and dremel without iatrogenic bleeding.  -Painful porokeratotic lesion(s) R 4th toe, submet head 2 left foot and submet head 2 right foot pared and enucleated with sterile scalpel blade without incident. -Patient to continue soft, supportive shoe gear daily. -Patient to report any pedal injuries to medical professional immediately. -Patient/POA to call should there be question/concern in the interim.  Return in about 3  months (around 10/03/2019) for diabetic nail and callus trim.  Marzetta Board, DPM

## 2019-08-29 DIAGNOSIS — N1831 Chronic kidney disease, stage 3a: Secondary | ICD-10-CM | POA: Diagnosis not present

## 2019-08-29 DIAGNOSIS — I10 Essential (primary) hypertension: Secondary | ICD-10-CM | POA: Diagnosis not present

## 2019-08-29 DIAGNOSIS — I480 Paroxysmal atrial fibrillation: Secondary | ICD-10-CM | POA: Diagnosis not present

## 2019-08-29 DIAGNOSIS — E1169 Type 2 diabetes mellitus with other specified complication: Secondary | ICD-10-CM | POA: Diagnosis not present

## 2019-08-29 DIAGNOSIS — F411 Generalized anxiety disorder: Secondary | ICD-10-CM | POA: Diagnosis not present

## 2019-08-29 DIAGNOSIS — M109 Gout, unspecified: Secondary | ICD-10-CM | POA: Diagnosis not present

## 2019-08-29 DIAGNOSIS — E7800 Pure hypercholesterolemia, unspecified: Secondary | ICD-10-CM | POA: Diagnosis not present

## 2019-10-03 ENCOUNTER — Ambulatory Visit: Payer: Medicare PPO | Admitting: Podiatry

## 2019-10-03 ENCOUNTER — Encounter: Payer: Self-pay | Admitting: Podiatry

## 2019-10-03 ENCOUNTER — Other Ambulatory Visit: Payer: Self-pay

## 2019-10-03 DIAGNOSIS — B351 Tinea unguium: Secondary | ICD-10-CM

## 2019-10-03 DIAGNOSIS — M79675 Pain in left toe(s): Secondary | ICD-10-CM

## 2019-10-03 DIAGNOSIS — Q828 Other specified congenital malformations of skin: Secondary | ICD-10-CM

## 2019-10-03 DIAGNOSIS — E119 Type 2 diabetes mellitus without complications: Secondary | ICD-10-CM | POA: Diagnosis not present

## 2019-10-03 DIAGNOSIS — M79674 Pain in right toe(s): Secondary | ICD-10-CM | POA: Diagnosis not present

## 2019-10-04 NOTE — Progress Notes (Signed)
Subjective: Jessica Baxter is a 79 y.o. female patient seen today preventative diabetic foot care and callus(es) b/l feet. Aggravating factors include weightbearing with and without shoe gear. Pain is relieved with periodic professional debridement.   She has h/o lymphedema b/l LE. Followed by Cardiology. She voices nonew pedal problems on today's visit.  Patient Active Problem List   Diagnosis Date Noted  . Excess weight 03/06/2018  . Hypertension   . Hypercholesteremia   . Chronic atrial fibrillation (HCC)   . Overweight(278.02)   . Osteopenia   . Impaired fasting glucose   . DYSPHAGIA 02/03/2009    Current Outpatient Medications on File Prior to Visit  Medication Sig Dispense Refill  . albuterol (PROVENTIL HFA;VENTOLIN HFA) 108 (90 Base) MCG/ACT inhaler Inhale 2 puffs into the lungs every 4 (four) hours as needed for wheezing or shortness of breath (cough, shortness of breath or wheezing.). 1 Inhaler 1  . ALPRAZolam (XANAX) 0.25 MG tablet Take 0.25 mg by mouth daily as needed for anxiety.   3  . COLCRYS 0.6 MG tablet Take 0.6 mg by mouth as needed (FOR GOUT).     Marland Kitchen diltiazem (CARDIZEM CD) 180 MG 24 hr capsule TAKE 1 CAPSULE BY MOUTH EVERY DAY (Patient taking differently: Take 180 mg by mouth daily. ) 90 capsule 3  . ELIQUIS 5 MG TABS tablet TAKE 1 TABLET BY MOUTH TWICE A DAY (Patient taking differently: Take 5 mg by mouth 2 (two) times daily. ) 180 tablet 1  . ezetimibe (ZETIA) 10 MG tablet Take 10 mg by mouth daily.    . furosemide (LASIX) 20 MG tablet Take 20 mg by mouth daily.  6  . losartan (COZAAR) 100 MG tablet Take 100 mg by mouth daily.    . Omega-3 Fatty Acids (FISH OIL PO) Take by mouth as directed.     . potassium chloride SA (KLOR-CON M20) 20 MEQ tablet Take 1 tablet (20 mEq total) by mouth daily. 90 tablet 3  . predniSONE (DELTASONE) 10 MG tablet     . Red Yeast Rice Extract (RED YEAST RICE PO) Take 1 tablet by mouth daily.     No current facility-administered  medications on file prior to visit.    No Known Allergies  Objective: Physical Exam  General: Jessica Baxter is a pleasant 79 y.o. AAF in NAD. AAO x 3.   Vascular:  Capillary refill time to digits immediate b/l. Palpable DP pulses b/l. Palpable PT pulses b/l. Pedal hair sparse b/l. Skin temperature gradient within normal limits b/l. Lymphedema present b/l LE.  Dermatological:  Pedal skin with normal turgor, texture and tone bilaterally. No open wounds bilaterally. No interdigital macerations bilaterally. Toenails 1-5 b/l elongated, dystrophic, thickened, crumbly with subungual debris and tenderness to dorsal palpation. Porokeratotic lesion(s) distal tip R 4th toe, submet head 2 left foot and submet head 2 right foot. No erythema, no edema, no drainage, no flocculence.  Musculoskeletal:  Normal muscle strength 5/5 to all lower extremity muscle groups bilaterally. No pain crepitus or joint limitation noted with ROM b/l. Hammertoes noted to the R 4th toe.  Neurological:  Protective sensation intact 5/5 intact bilaterally with 10g monofilament b/l. Vibratory sensation intact b/l. Proprioception intact bilaterally.  Assessment and Plan:  Problem List Items Addressed This Visit    None    Visit Diagnoses    Pain due to onychomycosis of toenails of both feet    -  Primary   Porokeratosis       Controlled type 2 diabetes  mellitus without complication, without long-term current use of insulin (HCC)        -Examined patient.  -No new findings. No new orders. -Continue diabetic foot care principles. Literature dispensed on today.  -Toenails 1-5 b/l were debrided in length and girth with sterile nail nippers and dremel without iatrogenic bleeding.  -Painful porokeratotic lesion(s) R 4th toe, submet head 2 left foot and submet head 2 right foot pared and enucleated with sterile scalpel blade without incident. -Patient to continue soft, supportive shoe gear daily. -Patient to report any pedal  injuries to medical professional immediately. -Patient/POA to call should there be question/concern in the interim.  Return in about 3 months (around 01/03/2020) for diabetic callus trim.  Freddie Breech, DPM

## 2019-10-10 DIAGNOSIS — E1169 Type 2 diabetes mellitus with other specified complication: Secondary | ICD-10-CM | POA: Diagnosis not present

## 2019-11-03 ENCOUNTER — Other Ambulatory Visit: Payer: Self-pay | Admitting: Cardiovascular Disease

## 2019-11-06 NOTE — Telephone Encounter (Signed)
Prescription refill request for Eliquis received. Indication:afib Last office visit: 04/27/19 Scr:1.28 on 05/07/19 Age: 79 Weight:88kg

## 2019-11-14 ENCOUNTER — Other Ambulatory Visit: Payer: Self-pay | Admitting: Cardiovascular Disease

## 2020-01-04 ENCOUNTER — Encounter: Payer: Self-pay | Admitting: Podiatry

## 2020-01-04 ENCOUNTER — Other Ambulatory Visit: Payer: Self-pay

## 2020-01-04 ENCOUNTER — Ambulatory Visit: Payer: Medicare PPO | Admitting: Podiatry

## 2020-01-04 DIAGNOSIS — B351 Tinea unguium: Secondary | ICD-10-CM

## 2020-01-04 DIAGNOSIS — Q828 Other specified congenital malformations of skin: Secondary | ICD-10-CM | POA: Diagnosis not present

## 2020-01-04 DIAGNOSIS — M79674 Pain in right toe(s): Secondary | ICD-10-CM

## 2020-01-04 DIAGNOSIS — E119 Type 2 diabetes mellitus without complications: Secondary | ICD-10-CM | POA: Diagnosis not present

## 2020-01-04 DIAGNOSIS — M79675 Pain in left toe(s): Secondary | ICD-10-CM | POA: Diagnosis not present

## 2020-01-06 NOTE — Progress Notes (Signed)
Subjective: Jessica Baxter is a 79 y.o. female patient seen today preventative diabetic foot care and callus(es) b/l feet. Aggravating factors include weightbearing with and without shoe gear. Pain is relieved with periodic professional debridement.   She has h/o lymphedema b/l LE. Followed by Cardiology. She voices no new pedal problems on today's visit.  She did not check her blood glucose this morning. States her blood glucose was 91 mg/dl yesterday and 619 mg/dl two days ago.   Patient Active Problem List   Diagnosis Date Noted   Osteoarthritis of left knee 02/14/2019   Excess weight 03/06/2018   Hypertension    Hypercholesteremia    Chronic atrial fibrillation (HCC)    Overweight(278.02)    Osteopenia    Impaired fasting glucose    DYSPHAGIA 02/03/2009    Current Outpatient Medications on File Prior to Visit  Medication Sig Dispense Refill   albuterol (PROVENTIL HFA;VENTOLIN HFA) 108 (90 Base) MCG/ACT inhaler Inhale 2 puffs into the lungs every 4 (four) hours as needed for wheezing or shortness of breath (cough, shortness of breath or wheezing.). 1 Inhaler 1   albuterol (VENTOLIN HFA) 108 (90 Base) MCG/ACT inhaler albuterol sulfate HFA 90 mcg/actuation aerosol inhaler     ALPRAZolam (XANAX) 0.25 MG tablet Take 0.25 mg by mouth daily as needed for anxiety.   3   diltiazem (CARDIZEM CD) 180 MG 24 hr capsule TAKE 1 CAPSULE BY MOUTH EVERY DAY 90 capsule 2   ELIQUIS 5 MG TABS tablet TAKE 1 TABLET BY MOUTH TWICE A DAY 180 tablet 1   ezetimibe (ZETIA) 10 MG tablet Take 10 mg by mouth daily.     furosemide (LASIX) 20 MG tablet Take 20 mg by mouth daily.  6   losartan (COZAAR) 100 MG tablet Take 100 mg by mouth daily.     MITIGARE 0.6 MG CAPS      Omega-3 Fatty Acids (FISH OIL PO) Take by mouth as directed.      potassium chloride SA (KLOR-CON M20) 20 MEQ tablet Take 1 tablet (20 mEq total) by mouth daily. 90 tablet 3   predniSONE (DELTASONE) 10 MG tablet       Red Yeast Rice Extract (RED YEAST RICE PO) Take 1 tablet by mouth daily.     No current facility-administered medications on file prior to visit.    No Known Allergies  Objective: Physical Exam  General: Jessica Baxter is a pleasant 79 y.o. AAF in NAD. AAO x 3.   Vascular:  Capillary refill time to digits immediate b/l. Palpable DP pulses b/l. Palpable PT pulses b/l. Pedal hair sparse b/l. Skin temperature gradient within normal limits b/l. Lymphedema present b/l LE.  Dermatological:  Pedal skin with normal turgor, texture and tone bilaterally. No open wounds bilaterally. No interdigital macerations bilaterally. Toenails 1-5 b/l elongated, dystrophic, thickened, crumbly with subungual debris and tenderness to dorsal palpation. Porokeratotic lesion(s) distal tip R 4th toe, submet head 2 left foot and submet head 2 right foot. No erythema, no edema, no drainage, no flocculence.  Musculoskeletal:  Normal muscle strength 5/5 to all lower extremity muscle groups bilaterally. No pain crepitus or joint limitation noted with ROM b/l. Hammertoes noted to the L 5th toe, R 4th toe and R 5th toe.  Neurological:  Protective sensation intact 5/5 intact bilaterally with 10g monofilament b/l. Vibratory sensation intact b/l. Proprioception intact bilaterally.  Assessment and Plan:  Problem List Items Addressed This Visit    None    Visit Diagnoses    Pain  due to onychomycosis of toenails of both feet    -  Primary   Porokeratosis       Controlled type 2 diabetes mellitus without complication, without long-term current use of insulin (HCC)        -Examined patient.  -No new findings. No new orders. -Continue diabetic foot care principles. Literature dispensed on today.  -Toenails 1-5 b/l were debrided in length and girth with sterile nail nippers and dremel without iatrogenic bleeding.  -Painful porokeratotic lesion(s) R 4th toe, submet head 2 left foot and submet head 2 right foot pared and  enucleated with sterile scalpel blade without incident. -Patient to continue soft, supportive shoe gear daily. -Patient to report any pedal injuries to medical professional immediately. -Patient/POA to call should there be question/concern in the interim.  Return in about 3 months (around 04/05/2020) for diabetic corn(s)/callus(es).  Freddie Breech, DPM

## 2020-01-09 DIAGNOSIS — F411 Generalized anxiety disorder: Secondary | ICD-10-CM | POA: Diagnosis not present

## 2020-01-09 DIAGNOSIS — E7800 Pure hypercholesterolemia, unspecified: Secondary | ICD-10-CM | POA: Diagnosis not present

## 2020-01-09 DIAGNOSIS — I10 Essential (primary) hypertension: Secondary | ICD-10-CM | POA: Diagnosis not present

## 2020-01-09 DIAGNOSIS — I480 Paroxysmal atrial fibrillation: Secondary | ICD-10-CM | POA: Diagnosis not present

## 2020-01-09 DIAGNOSIS — E663 Overweight: Secondary | ICD-10-CM | POA: Diagnosis not present

## 2020-01-09 DIAGNOSIS — E1169 Type 2 diabetes mellitus with other specified complication: Secondary | ICD-10-CM | POA: Diagnosis not present

## 2020-01-09 DIAGNOSIS — M109 Gout, unspecified: Secondary | ICD-10-CM | POA: Diagnosis not present

## 2020-01-10 ENCOUNTER — Other Ambulatory Visit: Payer: Self-pay | Admitting: Internal Medicine

## 2020-01-10 DIAGNOSIS — Z1231 Encounter for screening mammogram for malignant neoplasm of breast: Secondary | ICD-10-CM

## 2020-02-05 DIAGNOSIS — E119 Type 2 diabetes mellitus without complications: Secondary | ICD-10-CM | POA: Diagnosis not present

## 2020-02-05 DIAGNOSIS — H25813 Combined forms of age-related cataract, bilateral: Secondary | ICD-10-CM | POA: Diagnosis not present

## 2020-03-04 ENCOUNTER — Ambulatory Visit: Payer: Medicare PPO

## 2020-04-09 ENCOUNTER — Ambulatory Visit: Payer: Medicare PPO

## 2020-04-14 ENCOUNTER — Ambulatory Visit (INDEPENDENT_AMBULATORY_CARE_PROVIDER_SITE_OTHER): Payer: Medicare HMO | Admitting: Podiatry

## 2020-04-14 ENCOUNTER — Other Ambulatory Visit: Payer: Self-pay

## 2020-04-14 ENCOUNTER — Encounter: Payer: Self-pay | Admitting: Podiatry

## 2020-04-14 DIAGNOSIS — M79675 Pain in left toe(s): Secondary | ICD-10-CM | POA: Diagnosis not present

## 2020-04-14 DIAGNOSIS — E119 Type 2 diabetes mellitus without complications: Secondary | ICD-10-CM

## 2020-04-14 DIAGNOSIS — M79674 Pain in right toe(s): Secondary | ICD-10-CM

## 2020-04-14 DIAGNOSIS — L84 Corns and callosities: Secondary | ICD-10-CM | POA: Diagnosis not present

## 2020-04-14 DIAGNOSIS — Q828 Other specified congenital malformations of skin: Secondary | ICD-10-CM | POA: Diagnosis not present

## 2020-04-14 DIAGNOSIS — B351 Tinea unguium: Secondary | ICD-10-CM | POA: Diagnosis not present

## 2020-04-14 NOTE — Progress Notes (Signed)
Subjective: Jessica Baxter is a 80 y.o. female patient seen today preventative diabetic foot care and callus(es) b/l feet. Aggravating factors include weightbearing with and without shoe gear. Pain is relieved with periodic professional debridement.   She has h/o lymphedema b/l LE. Followed by Cardiology. She voices no new pedal problems on today's visit.  She did not check her blood glucose this morning. States her blood glucose was 104 mg/dl yesterday.  She voices no new pedal concerns on today's visit.  Dr. Renford Dills is her PCP and last visit was January 29, 2020, per patient recall.  No Known Allergies  Objective: Physical Exam  General: Jessica Baxter is a pleasant 80 y.o. AAF in NAD. AAO x 3.   Vascular:  Capillary refill time to digits immediate b/l. Palpable DP pulses b/l. Palpable PT pulses b/l. Pedal hair sparse b/l. Skin temperature gradient within normal limits b/l. Lymphedema present b/l LE.  Dermatological:  Pedal skin with normal turgor, texture and tone bilaterally. No open wounds bilaterally. No interdigital macerations bilaterally. Toenails 1-5 b/l elongated, dystrophic, thickened, crumbly with subungual debris and tenderness to dorsal palpation. Porokeratotic lesion(s) distal tip R 4th toe, submet head 2 left foot and submet head 2 right foot. No erythema, no edema, no drainage, no flocculence.  Musculoskeletal:  Normal muscle strength 5/5 to all lower extremity muscle groups bilaterally. No pain crepitus or joint limitation noted with ROM b/l. Hammertoes noted to the L 5th toe, R 4th toe and R 5th toe.  Neurological:  Protective sensation intact 5/5 intact bilaterally with 10g monofilament b/l. Vibratory sensation intact b/l. Proprioception intact bilaterally.  Assessment and Plan:  Problem List Items Addressed This Visit   None   Visit Diagnoses    Pain due to onychomycosis of toenails of both feet    -  Primary   Porokeratosis       Corns        Controlled type 2 diabetes mellitus without complication, without long-term current use of insulin (HCC)        -Examined patient.  -No new findings. No new orders. -Continue diabetic foot care principles. Literature dispensed on today.  -Toenails 1-5 b/l were debrided in length and girth with sterile nail nippers and dremel without iatrogenic bleeding.  -Painful porokeratotic lesion(s) R 4th toe, submet head 2 left foot and submet head 2 right foot pared and enucleated with sterile scalpel blade without incident. -Patient to continue soft, supportive shoe gear daily. -Patient to report any pedal injuries to medical professional immediately. -Patient/POA to call should there be question/concern in the interim.  Return in about 3 months (around 07/12/2020).  Freddie Breech, DPM

## 2020-04-21 ENCOUNTER — Other Ambulatory Visit: Payer: Self-pay | Admitting: Cardiovascular Disease

## 2020-04-21 NOTE — Telephone Encounter (Signed)
Eliquis 5mg  refill request received. Patient is 80 years old, weight-88kg, Crea-1.15 on 01/09/2020 via [facility] from [place], [place], and last seen by Dr. [place] on 04/27/2019 and pending appt on 04/24/2020 at 9am. Dose is appropriate based on dosing criteria. Will send in refill to requested pharmacy.

## 2020-04-21 NOTE — Progress Notes (Unsigned)
Cardiology Office Note Date:  04/24/2020  Patient ID:  Jessica Baxter 10-Jan-1941, MRN [id number] PCP:  Renford Dills, MD  Cardiologist: Dr. Eden Emms   Chief Complaint:  F/u afib   History of Present Illness: Jessica Baxter is a 80 y.o. female with history of chronic atrial fib, HTN, DM (diet-controlled), HLD, intolerance of BB due to fatigue  Rate control/anticoagulation strategy adomted  2D echo 05/15/19 Normal EF mild MR LA 46 mm   She is generally unaware of her atrial fibrillation. No chest pain, palpitations, SOB, syncope or bleeding   She is retired and more sedentary Weight is up and has increased edema  Discussed low sodium diet, she will not wear compression stockings does not improvement with leg elevation and complains about lasix making her urinate all the time   She has had COVID vaccine   Seen in ER 05/08/19 left leg weakness ? Arthritis CT ? Subacute left thalamic lacunar bland infarct But F/u MRI with no acute abnormality   No complaints   Past Medical History:  Diagnosis Date  . A-fib (HCC)   . Chronic atrial fibrillation (HCC)   . Diabetes mellitus (HCC)   . Essential hypertension   . Hypercholesteremia   . Obesity   . Osteopenia     Past Surgical History:  Procedure Laterality Date  . PARTIAL HYSTERECTOMY      Current Outpatient Medications  Medication Sig Dispense Refill  . albuterol (PROVENTIL HFA;VENTOLIN HFA) 108 (90 Base) MCG/ACT inhaler Inhale 2 puffs into the lungs every 4 (four) hours as needed for wheezing or shortness of breath (cough, shortness of breath or wheezing.). 1 Inhaler 1  . ALPRAZolam (XANAX) 0.25 MG tablet Take 0.25 mg by mouth daily as needed for anxiety.   3  . diltiazem (CARDIZEM CD) 180 MG 24 hr capsule TAKE 1 CAPSULE BY MOUTH EVERY DAY 90 capsule 2  . ELIQUIS 5 MG TABS tablet TAKE 1 TABLET BY MOUTH TWICE A DAY 180 tablet 1  . ezetimibe (ZETIA) 10 MG tablet Take 10 mg by mouth daily.    . furosemide (LASIX) 20 MG tablet  Take 20 mg by mouth daily.  6  . losartan (COZAAR) 100 MG tablet Take 100 mg by mouth daily.    Marland Kitchen MITIGARE 0.6 MG CAPS     . Omega-3 Fatty Acids (FISH OIL PO) Take by mouth as directed.     . potassium chloride SA (KLOR-CON M20) 20 MEQ tablet Take 1 tablet (20 mEq total) by mouth daily. 90 tablet 3  . Red Yeast Rice Extract (RED YEAST RICE PO) Take 1 tablet by mouth daily.     No current facility-administered medications for this visit.    Allergies:   Patient has no known allergies.   Social History:  The patient  reports that she has never smoked. She has never used smokeless tobacco. She reports that she does not drink alcohol and does not use drugs.   Family History:  The patient's family history includes Hypertension in her mother; Stroke in her brother.  ROS:  Please see the history of present illness. Trace ankle edema when swinging or hanging legs that resolves when elevating legs. All other systems are reviewed and otherwise negative.   PHYSICAL EXAM:  VS:  BP 130/78   Pulse 99   Ht 5\' 2"  (1.575 m)   Wt 88.9 kg   SpO2 99%   BMI 35.85 kg/m  BMI: Body mass index is 35.85 kg/m.    Affect  appropriate Healthy:  appears stated age HEENT: normal Neck supple with no adenopathy JVP normal no bruits no thyromegaly Lungs clear with no wheezing and good diaphragmatic motion Heart:  S1/S2 no murmur, no rub, gallop or click PMI normal Abdomen: benighn, BS positve, no tenderness, no AAA no bruit.  No HSM or HJR Distal pulses intact with no bruits Plus 2 LE edema some appears to be lymph edema  Neuro non-focal Skin warm and dry No muscular weakness    EKG:  10/31/18 afib rate 89 nonspecific ST changes 04/24/2020 afib rate 99 nonspecific ST changes    CrCl cannot be calculated (Patient's most recent lab result is older than the maximum 21 days allowed.).   Wt Readings from Last 3 Encounters:  04/24/20 88.9 kg  04/27/19 88 kg  10/31/18 88.5 kg     Other studies  reviewed: Echo 04/25/13   ASSESSMENT AND PLAN:  1. Chronic atrial fib - increase cardizem to 240 mg continue  anticoagulation  TTE 05/15/19 Normal EF mild MR  2. Essential HTN - Well controlled.  Continue current medications and low sodium Dash type diet.   3. HLD - lipids are being followed by PCP.Using red yeast rice  4. Obesity - the patient plans to utilize the gym membership she has in the new year. I congratulated her on this decision. 5. DM-2  Diet controlled continue portion control and low carbs A1c with primary  6.   Edema:  Dependant from diet and weight gain  Component lymphedema Lasix   Disposition: F/u with me in a year  Tests:  None   Cardizem increased to 240 mg  Daily   Current medicines are reviewed at length with the patient today.  The patient did not have any concerns regarding medicines.  Charlton Haws

## 2020-04-24 ENCOUNTER — Ambulatory Visit: Payer: Medicare HMO | Admitting: Cardiovascular Disease

## 2020-04-24 ENCOUNTER — Encounter: Payer: Self-pay | Admitting: Cardiovascular Disease

## 2020-04-24 ENCOUNTER — Other Ambulatory Visit: Payer: Self-pay

## 2020-04-24 VITALS — BP 130/78 | HR 99 | Ht 62.0 in | Wt 196.0 lb

## 2020-04-24 DIAGNOSIS — I482 Chronic atrial fibrillation, unspecified: Secondary | ICD-10-CM

## 2020-04-24 MED ORDER — DILTIAZEM HCL ER COATED BEADS 240 MG PO CP24: 240.0000 mg | ORAL_CAPSULE | Freq: Every day | ORAL | 3 refills | Status: DC

## 2020-04-24 NOTE — Patient Instructions (Signed)
Medication Instructions:  Your physician has recommended you make the following change in your medication:  1-INCREASE Cardizem 240 mg by mouth daily.  *If you need a refill on your cardiac medications before your next appointment, please call your pharmacy*  Lab Work: If you have labs (blood work) drawn today and your tests are completely normal, you will receive your results only by: Marland Kitchen MyChart Message (if you have MyChart) OR . A paper copy in the mail If you have any lab test that is abnormal or we need to change your treatment, we will call you to review the results.  Testing/Procedures: None ordered today.  Follow-Up: At [facility], you and your health needs are our priority.  As part of our continuing mission to provide you with exceptional heart care, we have created designated Provider Care Teams.  These Care Teams include your primary Cardiologist (physician) and Advanced Practice Providers (APPs -  Physician Assistants and Nurse Practitioners) who all work together to provide you with the care you need, when you need it.  We recommend signing up for the patient portal called "MyChart".  Sign up information is provided on this After Visit Summary.  MyChart is used to connect with patients for Virtual Visits (Telemedicine).  Patients are able to view lab/test results, encounter notes, upcoming appointments, etc.  Non-urgent messages can be sent to your provider as well.   To learn more about what you can do with MyChart, go to [email or web address].    Your next appointment:   1 year(s)  The format for your next appointment:   In Person  Provider:   You may see Dr. Eden Emms or one of the following Advanced Practice Providers on your designated Care Team:    Georgie Chard, NP

## 2020-04-30 DIAGNOSIS — H52223 Regular astigmatism, bilateral: Secondary | ICD-10-CM | POA: Diagnosis not present

## 2020-04-30 DIAGNOSIS — H524 Presbyopia: Secondary | ICD-10-CM | POA: Diagnosis not present

## 2020-05-08 DIAGNOSIS — E7800 Pure hypercholesterolemia, unspecified: Secondary | ICD-10-CM | POA: Diagnosis not present

## 2020-05-08 DIAGNOSIS — F411 Generalized anxiety disorder: Secondary | ICD-10-CM | POA: Diagnosis not present

## 2020-05-08 DIAGNOSIS — M109 Gout, unspecified: Secondary | ICD-10-CM | POA: Diagnosis not present

## 2020-05-08 DIAGNOSIS — I480 Paroxysmal atrial fibrillation: Secondary | ICD-10-CM | POA: Diagnosis not present

## 2020-05-08 DIAGNOSIS — E1169 Type 2 diabetes mellitus with other specified complication: Secondary | ICD-10-CM | POA: Diagnosis not present

## 2020-05-08 DIAGNOSIS — Z0000 Encounter for general adult medical examination without abnormal findings: Secondary | ICD-10-CM | POA: Diagnosis not present

## 2020-05-08 DIAGNOSIS — I10 Essential (primary) hypertension: Secondary | ICD-10-CM | POA: Diagnosis not present

## 2020-05-08 DIAGNOSIS — E8770 Fluid overload, unspecified: Secondary | ICD-10-CM | POA: Diagnosis not present

## 2020-05-15 ENCOUNTER — Telehealth: Payer: Self-pay | Admitting: Cardiovascular Disease

## 2020-05-15 NOTE — Telephone Encounter (Signed)
    Pt c/o medication issue:  1. Name of Medication: ELIQUIS 5 MG TABS tablet  2. How are you currently taking this medication (dosage and times per day)? TAKE 1 TABLET BY MOUTH TWICE A DAY  3. Are you having a reaction (difficulty breathing--STAT)?   4. What is your medication issue? Pt said her eliquis increased price from $80 to $135 for 3 months. She asked if Dr. Eden Emms if he can prescribed a alternative affordable meds

## 2020-05-15 NOTE — Telephone Encounter (Signed)
Will send to phram D to see what alternatives they suggest, and pre/auth nurse to see if there is anything we can do.

## 2020-05-16 NOTE — Telephone Encounter (Addendum)
Her [facility] plan that we have scanned in Epic from last year still covers Eliquis as a Tier 2 med (which is $80 for a 3 month supply). Called pharmacy, they ran rx under her [employer] Part D. Looks like she also has a Scientist, physiological, copay is ~$160 with that plan but pt can use copay card. I have activated copay card for pt and called it into her pharmacy which brings copay down to $30 for a 3 month supply. Pt already paid $135 about a week ago, pharmacy states they are unable to rebill with other plan/card since card was not active when she picked up initial rx. They did confirm that when she's due for her next fill, the copay will be $30 though. Called pt to make her aware, she did not answer and mailbox was full.  She will need to be made aware of the above I will also mail her copay card and ask her to bring it to the pharmacy when she's due for her next fill - pharmacy stated they will need to be reminded to bill the next fill under her [facility] card with copay card.

## 2020-05-19 NOTE — Telephone Encounter (Signed)
Called patient about message. Patient had several questions, will see if Pharm D can call her back to help.

## 2020-05-19 NOTE — Telephone Encounter (Signed)
Patients [employer] this year is not through state health plan. Its [employer] gold. I advised that it looks like she has a commerical state health plan Rx coverage with [facility] that is active. She did not seem to know this. I advised she call the state health plan to see what coverage she has. Cannot test the coverage until she is due to refill. Advised she needs to tell pharmacy to bill to [facility] plan and use copay card. She can call with questions.

## 2020-05-20 NOTE — Telephone Encounter (Signed)
I called pt and advised her that I was incorrect before and we do know that her [facility] card does work at the pharmacy and her copay will be $30 next time. She needs to remind [facility] to bill her [facility] plan and NOT [employer]. They also need to bill copay card and secondary. Copay card is on file, but she will also get card in the mail along with the instructions on what to tell [facility] when she needs a refill.

## 2020-05-29 ENCOUNTER — Other Ambulatory Visit: Payer: Self-pay

## 2020-05-29 ENCOUNTER — Ambulatory Visit
Admission: RE | Admit: 2020-05-29 | Discharge: 2020-05-29 | Disposition: A | Discharge status: Home / Self Care | Payer: Medicare HMO | Source: Ambulatory Visit | Attending: Internal Medicine | Admitting: Internal Medicine

## 2020-05-29 ENCOUNTER — Ambulatory Visit: Payer: Medicare HMO

## 2020-05-29 DIAGNOSIS — Z1231 Encounter for screening mammogram for malignant neoplasm of breast: Secondary | ICD-10-CM

## 2020-07-23 ENCOUNTER — Ambulatory Visit: Payer: Medicare HMO | Admitting: Podiatry

## 2020-07-24 DIAGNOSIS — E7800 Pure hypercholesterolemia, unspecified: Secondary | ICD-10-CM | POA: Diagnosis not present

## 2020-07-24 DIAGNOSIS — D6869 Other thrombophilia: Secondary | ICD-10-CM | POA: Diagnosis not present

## 2020-07-24 DIAGNOSIS — I480 Paroxysmal atrial fibrillation: Secondary | ICD-10-CM | POA: Diagnosis not present

## 2020-07-24 DIAGNOSIS — E1169 Type 2 diabetes mellitus with other specified complication: Secondary | ICD-10-CM | POA: Diagnosis not present

## 2020-07-24 DIAGNOSIS — F411 Generalized anxiety disorder: Secondary | ICD-10-CM | POA: Diagnosis not present

## 2020-07-24 DIAGNOSIS — I10 Essential (primary) hypertension: Secondary | ICD-10-CM | POA: Diagnosis not present

## 2020-08-06 ENCOUNTER — Ambulatory Visit (INDEPENDENT_AMBULATORY_CARE_PROVIDER_SITE_OTHER): Payer: Medicare HMO | Admitting: Podiatry

## 2020-08-06 ENCOUNTER — Other Ambulatory Visit: Payer: Self-pay

## 2020-08-06 ENCOUNTER — Encounter: Payer: Self-pay | Admitting: Podiatry

## 2020-08-06 DIAGNOSIS — M79674 Pain in right toe(s): Secondary | ICD-10-CM | POA: Diagnosis not present

## 2020-08-06 DIAGNOSIS — E119 Type 2 diabetes mellitus without complications: Secondary | ICD-10-CM | POA: Diagnosis not present

## 2020-08-06 DIAGNOSIS — B351 Tinea unguium: Secondary | ICD-10-CM | POA: Diagnosis not present

## 2020-08-06 DIAGNOSIS — M1990 Unspecified osteoarthritis, unspecified site: Secondary | ICD-10-CM | POA: Insufficient documentation

## 2020-08-06 DIAGNOSIS — M79675 Pain in left toe(s): Secondary | ICD-10-CM

## 2020-08-06 DIAGNOSIS — Q828 Other specified congenital malformations of skin: Secondary | ICD-10-CM | POA: Diagnosis not present

## 2020-08-06 DIAGNOSIS — F419 Anxiety disorder, unspecified: Secondary | ICD-10-CM | POA: Insufficient documentation

## 2020-08-06 DIAGNOSIS — E1169 Type 2 diabetes mellitus with other specified complication: Secondary | ICD-10-CM | POA: Insufficient documentation

## 2020-08-12 NOTE — Progress Notes (Signed)
  Subjective:  Patient ID: Jessica Baxter, female    DOB: Jul 09, 1940,  MRN: [id number]  Jessica Baxter presents to clinic today for preventative diabetic foot care and painful porokeratotic lesion(s) bilateral feet and painful mycotic toenails that limit ambulation. Painful toenails interfere with ambulation. Aggravating factors include wearing enclosed shoe gear. Pain is relieved with periodic professional debridement. Painful porokeratotic lesions are aggravated when weightbearing with and without shoegear. Pain is relieved with periodic professional debridement.  Patient's PCP is Dr. Renford Dills and last visit was 05/08/2020.  No Known Allergies  Review of Systems: Negative except as noted in the HPI. Objective:   Constitutional Jessica Baxter is a pleasant 80 y.o. African American female, in NAD. AAO x 3.   Vascular Capillary refill time to digits immediate b/l. Palpable pedal pulses b/l LE. Pedal hair sparse. Lower extremity skin temperature gradient within normal limits. No cyanosis or clubbing noted.  Neurologic Normal speech. Oriented to person, place, and time. Protective sensation intact 5/5 intact bilaterally with 10g monofilament b/l. Vibratory sensation intact b/l.  Dermatologic Pedal skin with normal turgor, texture and tone bilaterally. No open wounds bilaterally. No interdigital macerations bilaterally. Toenails 1-5 b/l elongated, discolored, dystrophic, thickened, crumbly with subungual debris and tenderness to dorsal palpation. Porokeratotic lesion(s) R 4th toe, submet head 2 left foot, and submet head 2 right foot. No erythema, no edema, no drainage, no fluctuance.  Orthopedic: Normal muscle strength 5/5 to all lower extremity muscle groups bilaterally. No pain crepitus or joint limitation noted with ROM b/l. Hammertoe(s) noted to the L 5th toe, R 4th toe, and R 5th toe.   Radiographs: None Assessment:   1. Pain due to onychomycosis of toenails of both feet   2.  Porokeratosis   3. Controlled type 2 diabetes mellitus without complication, without long-term current use of insulin (HCC)    Plan:  Patient was evaluated and treated and all questions answered.  Onychomycosis with pain -Nails palliatively debridement as below -Educated on self-care  Procedure: Nail Debridement Rationale: Pain Type of Debridement: manual, sharp debridement. Instrumentation: Nail nipper, rotary burr. Number of Nails: 10 -Examined patient. -Continue diabetic foot care principles. -Patient to continue soft, supportive shoe gear daily. -Toenails 1-5 b/l were debrided in length and girth with sterile nail nippers and dremel without iatrogenic bleeding.  -Painful porokeratotic lesion(s) R 4th toe, submet head 2 left foot, and submet head 2 right foot pared and enucleated with sterile scalpel blade without incident. Total number of lesions debrided=3. -Patient to report any pedal injuries to medical professional immediately. -Patient/POA to call should there be question/concern in the interim.  Return in about 3 months (around 11/06/2020) for nail trim.  Freddie Breech, DPM

## 2020-08-15 ENCOUNTER — Telehealth: Payer: Self-pay | Admitting: Cardiovascular Disease

## 2020-08-15 NOTE — Telephone Encounter (Signed)
Patient called  in to say that we gave her a card where she could the ELIQUIS 5 MG TABS tablet for $30. Patient stated when she went to go pick it up it was still $135. Patient states what to much for pay for. Patient wants to know can she to different medication or if help in pay for the medication

## 2020-08-15 NOTE — Telephone Encounter (Signed)
**Note De-Identified Alando Colleran Obfuscation** I called [facility] to ask if they ran the pts Eliquis under [employer] or [facility] and was advised that they ran it under [facility]. I requested that they try it again using [facility]. They did and advised me that Eliquis is the same cost to the pt under [facility] and/or [employer].  Forwarding this call to the pharmacist who helped the pt with this on 05/15/20 to request that she take a look at this as I am unsure how to handle.

## 2020-08-15 NOTE — Telephone Encounter (Signed)
Will forward this message to our Prior Auth Nurse to further assist the pt with Eliquis cost, if she can.

## 2020-08-18 NOTE — Telephone Encounter (Signed)
**Note De-Identified Jessica Baxter Obfuscation** I called [facility] and asked them to run the pts Eliquis RX under her [facility] plan and then to SECONDARY BILL it to the copay card on file. They did and then advised me that the cost to the pt is now $30/90 day supply. And that they will contact the pt as they owe her a reimbursement.

## 2020-10-08 ENCOUNTER — Other Ambulatory Visit: Payer: Self-pay | Admitting: Cardiovascular Disease

## 2020-10-08 NOTE — Telephone Encounter (Signed)
Pt last saw Dr Eden Emms 04/24/20, last labs 05/08/20 Creat 1.1 at [facility] per [facility], age 80, weight 88.9kg, based on specified criteria pt is on appropriate dosage of Eliquis 5mg  BID.  Will refill rx.

## 2020-10-23 ENCOUNTER — Telehealth: Payer: Self-pay | Admitting: Cardiovascular Disease

## 2020-10-23 DIAGNOSIS — L821 Other seborrheic keratosis: Secondary | ICD-10-CM | POA: Diagnosis not present

## 2020-10-23 NOTE — Telephone Encounter (Signed)
Called patient back about her SOB with exercise. Patient stated she got winded the other day walking on a hill to an office appointment. Patient stated her family was concerned about her because of how worn out she was. Patient stated she is fine but she does not regularly exercise and she would like to. Informed patient that walking is a great exercise and she could start with walking 10 minutes a day and work her way up. Patient denies any chest pain. Encouraged patient to stop activity if she gets SOB, and see if it resolves. If her SOB does not go away with rest informed patient to go to ED. Patient is due for follow up in 6 months, and has appointment with PCP 11/07/20. Will see if Dr. Eden Emms needs the patient to come in earlier.

## 2020-10-23 NOTE — Telephone Encounter (Signed)
Pt c/o Shortness Of Breath: STAT if SOB developed within the last 24 hours or pt is noticeably SOB on the phone  1. Are you currently SOB (can you hear that pt is SOB on the phone)? no  2. How long have you been experiencing SOB? while  3. Are you SOB when sitting or when up moving around? Moving around  4. Are you currently experiencing any other symptoms? no

## 2020-10-27 NOTE — Telephone Encounter (Signed)
Left message for patient to call back  

## 2020-11-07 DIAGNOSIS — E1169 Type 2 diabetes mellitus with other specified complication: Secondary | ICD-10-CM | POA: Diagnosis not present

## 2020-11-07 DIAGNOSIS — R600 Localized edema: Secondary | ICD-10-CM | POA: Diagnosis not present

## 2020-11-07 DIAGNOSIS — E1122 Type 2 diabetes mellitus with diabetic chronic kidney disease: Secondary | ICD-10-CM | POA: Diagnosis not present

## 2020-11-07 DIAGNOSIS — E663 Overweight: Secondary | ICD-10-CM | POA: Diagnosis not present

## 2020-11-07 DIAGNOSIS — I10 Essential (primary) hypertension: Secondary | ICD-10-CM | POA: Diagnosis not present

## 2020-11-07 DIAGNOSIS — D6869 Other thrombophilia: Secondary | ICD-10-CM | POA: Diagnosis not present

## 2020-11-07 DIAGNOSIS — E8770 Fluid overload, unspecified: Secondary | ICD-10-CM | POA: Diagnosis not present

## 2020-11-07 DIAGNOSIS — I480 Paroxysmal atrial fibrillation: Secondary | ICD-10-CM | POA: Diagnosis not present

## 2020-11-07 DIAGNOSIS — N1831 Chronic kidney disease, stage 3a: Secondary | ICD-10-CM | POA: Diagnosis not present

## 2020-11-07 DIAGNOSIS — E7800 Pure hypercholesterolemia, unspecified: Secondary | ICD-10-CM | POA: Diagnosis not present

## 2020-11-12 ENCOUNTER — Encounter: Payer: Self-pay | Admitting: Podiatry

## 2020-11-12 ENCOUNTER — Other Ambulatory Visit: Payer: Self-pay

## 2020-11-12 ENCOUNTER — Ambulatory Visit: Payer: Medicare HMO | Admitting: Podiatry

## 2020-11-12 DIAGNOSIS — M79675 Pain in left toe(s): Secondary | ICD-10-CM

## 2020-11-12 DIAGNOSIS — M79674 Pain in right toe(s): Secondary | ICD-10-CM

## 2020-11-12 DIAGNOSIS — Q828 Other specified congenital malformations of skin: Secondary | ICD-10-CM

## 2020-11-12 DIAGNOSIS — E119 Type 2 diabetes mellitus without complications: Secondary | ICD-10-CM

## 2020-11-12 DIAGNOSIS — B351 Tinea unguium: Secondary | ICD-10-CM | POA: Diagnosis not present

## 2020-11-12 NOTE — Progress Notes (Signed)
  Subjective:  Patient ID: Jessica Baxter, female    DOB: [birth year],  MRN: [id number]  80 y.o. female presents preventative diabetic foot care and painful porokeratotic lesion(s) of both feet and painful mycotic toenails that limit ambulation. Painful toenails interfere with ambulation. Aggravating factors include wearing enclosed shoe gear. Pain is relieved with periodic professional debridement. Painful porokeratotic lesions are aggravated when weightbearing with and without shoegear. Pain is relieved with periodic professional debridement.  She states she has removed salt from her diet to assist with her lower extremity edema.  She is a diet controlled diabetic. Patient states blood glucose was 111 mg/dl this morning.  PCP is Renford Dills, MD , and last visit was 07/24/2020.  No Known Allergies  Review of Systems: Negative except as noted in the HPI.   Objective:  Vascular Examination: Vascular status intact b/l with palpable pedal pulses. CFT immediate b/l. No edema. No pain with calf compression b/l. Skin temperature gradient WNL b/l. Pedal hair sparse b/l. Nonpitting ankle edema noted b/l LE. Trace edema noted forefoot area b/l.  Neurological Examination: Sensation grossly intact b/l with 10 gram monofilament. Vibratory sensation intact b/l.   Dermatological Examination: Pedal skin with normal turgor, texture and tone b/l. Toenails 1-5 b/l thick, discolored, elongated with subungual debris and pain on dorsal palpation. Porokeratotic lesion(s) submet head 2 left foot and submet head 2 right foot. No erythema, no edema, no drainage, no fluctuance.  Musculoskeletal Examination: Muscle strength 5/5 to b/l LE.  Radiographs: None Assessment:   1. Pain due to onychomycosis of toenails of both feet   2. Porokeratosis   3. Controlled type 2 diabetes mellitus without complication, without long-term current use of insulin (HCC)     Plan:  -Continue diabetic foot care principles:  inspect feet daily, monitor glucose as recommended by PCP and/or Endocrinologist, and follow prescribed diet per PCP, Endocrinologist and/or dietician. -Patient to continue soft, supportive shoe gear daily. -Toenails 1-5 b/l were debrided in length and girth with sterile nail nippers and dremel without iatrogenic bleeding.  -Painful porokeratotic lesion(s) submet head 2 left foot and submet head 2 right foot pared and enucleated with sterile scalpel blade without incident. Total number of lesions debrided=2. -Patient to report any pedal injuries to medical professional immediately. -Patient/POA to call should there be question/concern in the interim.  Return in about 3 months (around 02/11/2021).  Freddie Breech, DPM

## 2020-12-15 ENCOUNTER — Telehealth: Payer: Self-pay | Admitting: Cardiovascular Disease

## 2020-12-15 NOTE — Telephone Encounter (Signed)
Patient wanted to know if it would  be OK for her to travel to [place].  Her cousin would be doing all of the driving. She would be a passenger  Please advise

## 2020-12-15 NOTE — Telephone Encounter (Signed)
Jessica Stade, MD to Me     4:53 PM I see no reason from cardiac perspective not to go   I placed call to patient and left message to call office

## 2020-12-16 NOTE — Telephone Encounter (Signed)
Patient aware of Dr. Nishan's response.  

## 2020-12-16 NOTE — Telephone Encounter (Signed)
Left message for patient to call back  

## 2020-12-16 NOTE — Telephone Encounter (Signed)
Left second message for patient to call back.

## 2020-12-24 NOTE — Telephone Encounter (Signed)
Per Dr. Eden Emms, "She's had chronic afib with normal EF and only mild MR can check echo to make sure nothing has changed". If patient calls back will let her know we can get an echo and schedule it before her appointment.

## 2021-01-04 IMAGING — MR MR HEAD W/O CM
10 of 11 series · 43 of 48 positions shown · non-contrast
Comparison: Prior CT from 05/07/2019.

CLINICAL DATA: Initial evaluation for acute left lower extremity
weakness, possible stroke.

EXAM:
MRI HEAD WITHOUT CONTRAST
TECHNIQUE: Multiplanar, multiecho pulse sequences of the brain and surrounding
structures were obtained without intravenous contrast.

[Series 5: DWI · axial · 3.0mm · 0.88mm/px · z∈[-79,+58]mm · 10 of 94 slices shown (1 of 4)]
[im 1/94]
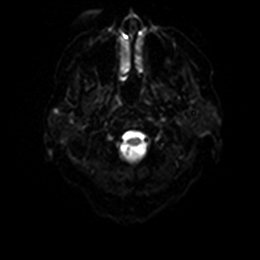
[im 11/94]
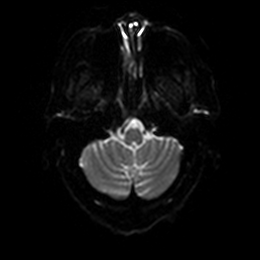
[im 21/94]
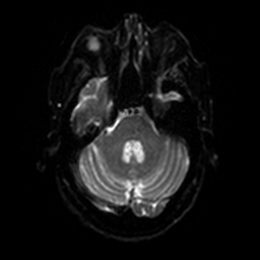
[im 32/94]
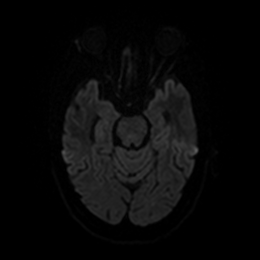
[im 42/94]
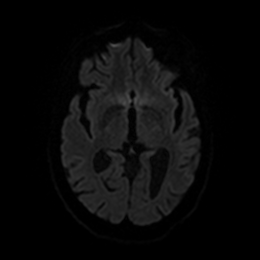
[im 52/94]
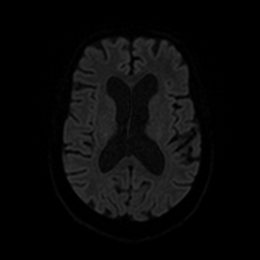
[im 63/94]
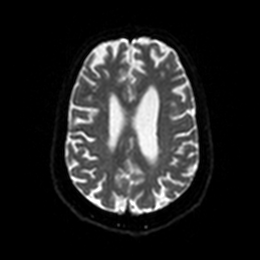
[im 73/94]
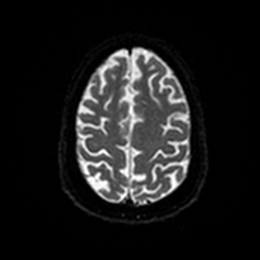
[im 83/94]
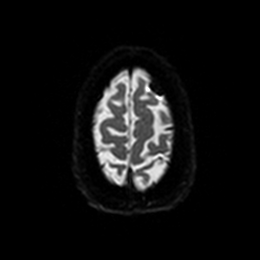
[im 94/94]
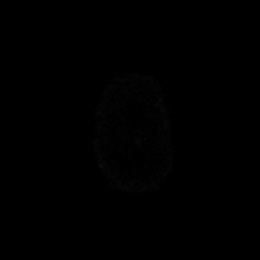

[Series 6: DWI · axial · 3.0mm · 0.88mm/px · z∈[-79,+58]mm · 5 of 47 slices shown (2 of 4)]
[im 1/47]
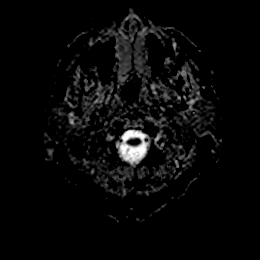
[im 12/47]
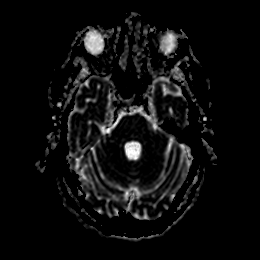
[im 24/47]
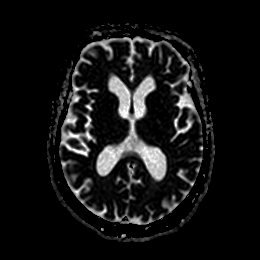
[im 35/47]
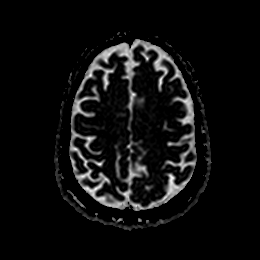
[im 47/47]
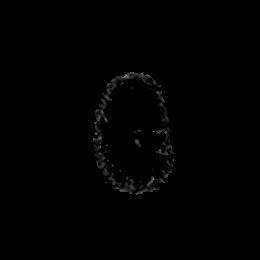

[Series 7: DWI · coronal · 4.0mm · 0.88mm/px · 6 of 70 slices shown (3 of 4)]
[im 1/70]
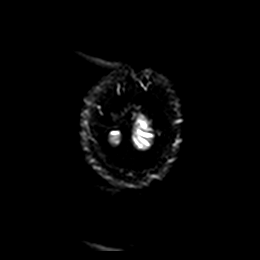
[im 14/70]
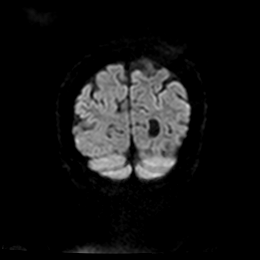
[im 28/70]
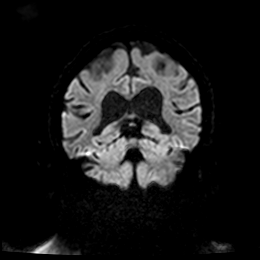
[im 42/70]
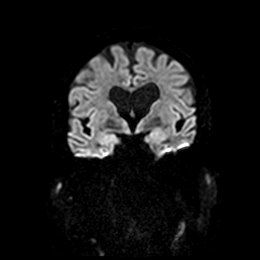
[im 56/70]
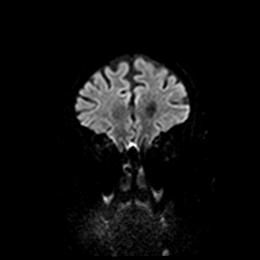
[im 70/70]
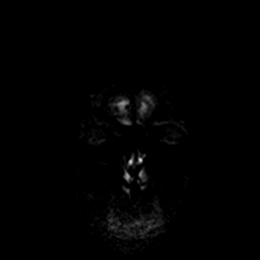

[Series 8: DWI · coronal · 4.0mm · 0.88mm/px · 3 of 35 slices shown (4 of 4)]
[im 1/35]
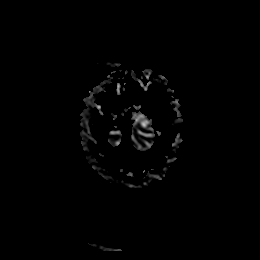
[im 18/35]
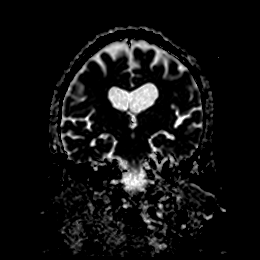
[im 35/35]
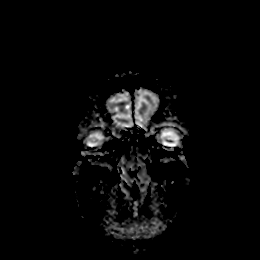

[Series 9: T1 · sagittal · 5.0mm · 0.75mm/px · 2 of 25 slices shown]
[im 1/25]
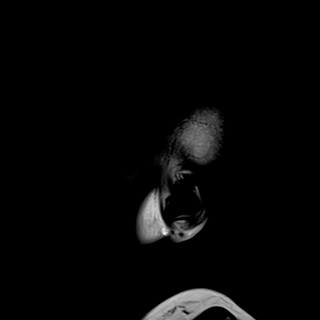
[im 25/25]
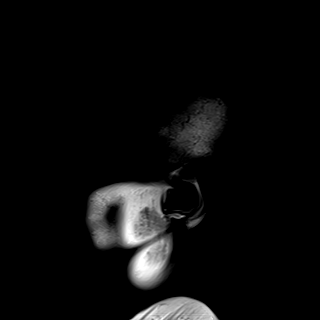

[Series 10: T2 · axial · 5.0mm · 0.72mm/px · z∈[-82,+61]mm · 2 of 25 slices shown (1 of 2)]
[im 1/25]
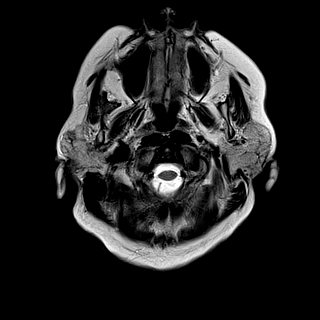
[im 25/25]
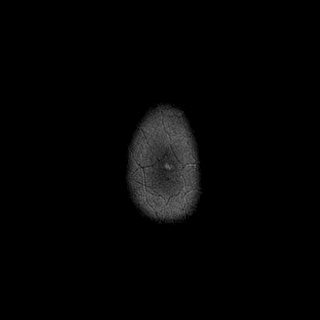

[Series 11: FLAIR · axial · 5.0mm · 0.45mm/px · z∈[-80,+63]mm · 2 of 25 slices shown]
[im 1/25]
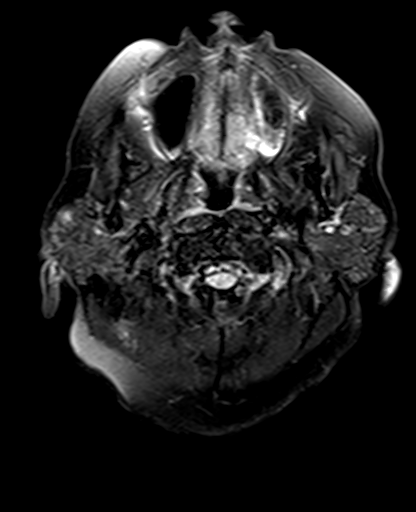
[im 25/25]
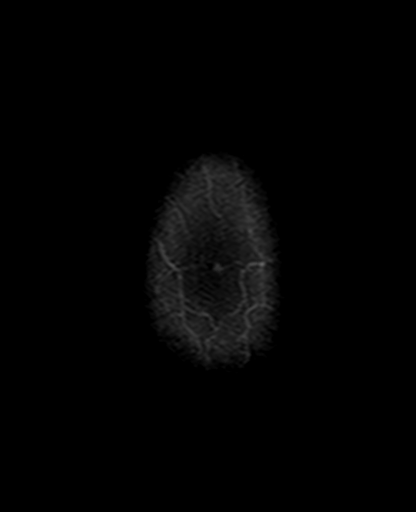

[Series 13: pha_images · axial · 3.0mm · 0.90mm/px · z∈[-97,+71]mm · 5 of 57 slices shown]
[im 1/57]
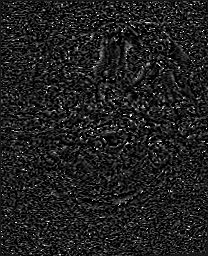
[im 15/57]
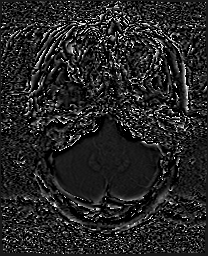
[im 29/57]
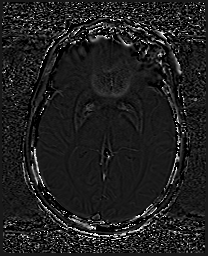
[im 43/57]
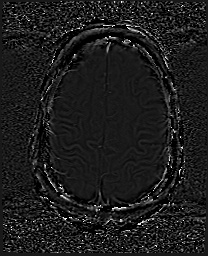
[im 57/57]
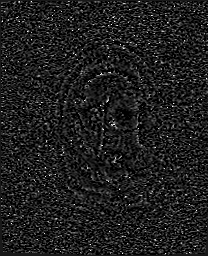

[Series 14: swi_images · axial · 3.0mm · 0.90mm/px · z∈[-97,+80]mm · 5 of 60 slices shown]
[im 1/60]
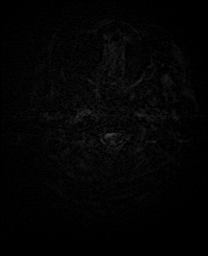
[im 15/60]
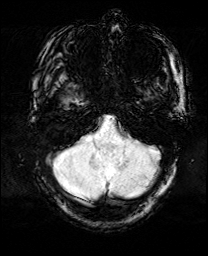
[im 30/60]
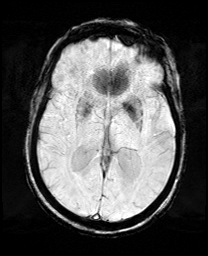
[im 45/60]
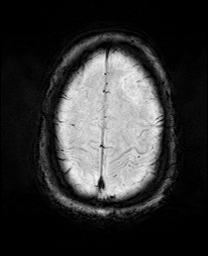
[im 60/60]
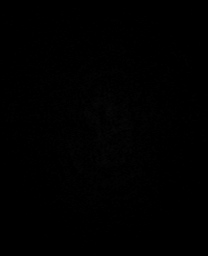

[Series 17: T2 · coronal · 5.0mm · 0.34mm/px · 3 of 29 slices shown (2 of 2)]
[im 1/29]
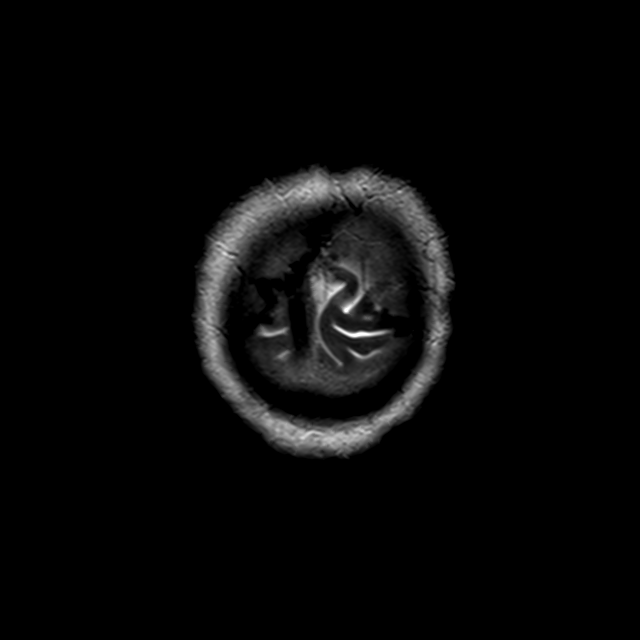
[im 15/29]
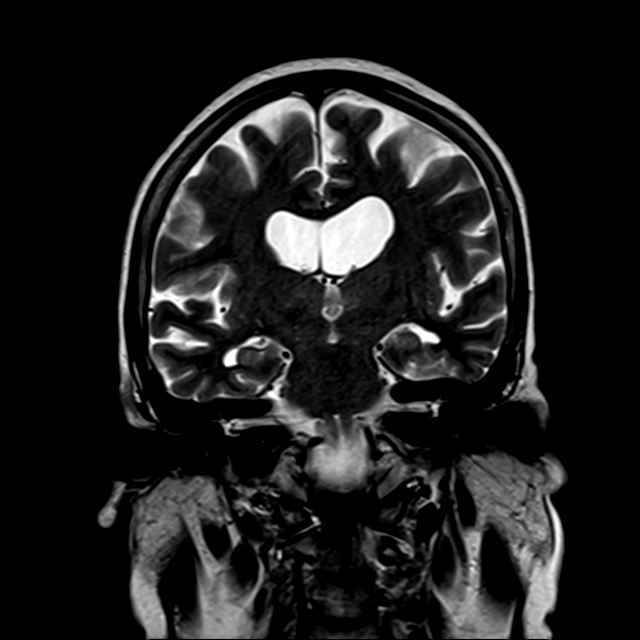
[im 29/29]
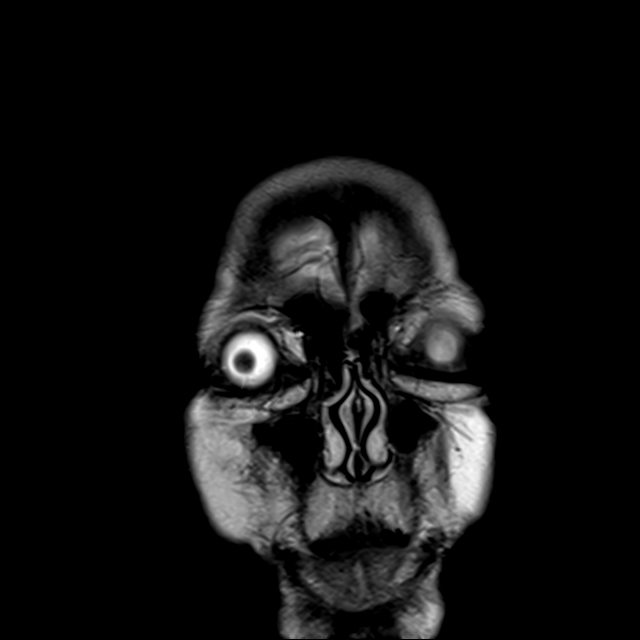

[43 of 48 positions shown; findings below may reference images not displayed]

FINDINGS: Brain: Cerebral volume within normal limits for age. Few scattered
foci of subcentimeter T2/FLAIR hyperintensity noted within the
periventricular and deep white matter both cerebral hemispheres,
nonspecific, but most like related chronic microvascular ischemic
disease, minimal for age.

No abnormal foci of restricted diffusion to suggest acute or
subacute ischemia. Gray-white matter differentiation maintained. No
encephalomalacia to suggest chronic cortical infarction. No foci of
susceptibility artifact to suggest acute or chronic intracranial
hemorrhage.

No mass lesion, midline shift or mass effect. Mild ventricular
prominence related to global parenchymal volume loss of
hydrocephalus. No extra-axial fluid collection. Pituitary gland and
suprasellar region normal. Midline structures intact.

Vascular: Major intracranial vascular flow voids are well
maintained.

Skull and upper cervical spine: Craniocervical junction normal.
Multilevel degenerative spondylosis noted within the upper cervical
spine with resultant mild-to-moderate spinal stenosis at C3-4. Bone
marrow signal intensity within normal limits. No scalp soft tissue
abnormality.

Sinuses/Orbits: Globes and orbital soft tissues demonstrate no acute
finding. Probable remote fracture of the left lamina papyracea
noted. Paranasal sinuses are clear. No mastoid effusion. Inner ear
structures grossly normal.

Other: None.
IMPRESSION: 1. No acute intracranial abnormality.
2. Mild for age chronic microvascular ischemic disease.
3. Multilevel degenerative spondylosis within the upper cervical
spine with resultant mild to moderate spinal stenosis at C3-4.
Finding could be further assessed with dedicated MRI of the cervical
spine as clinically warranted.

## 2021-02-11 ENCOUNTER — Encounter: Payer: Self-pay | Admitting: Podiatry

## 2021-02-11 ENCOUNTER — Ambulatory Visit: Payer: Medicare HMO | Admitting: Podiatry

## 2021-02-11 ENCOUNTER — Other Ambulatory Visit: Payer: Self-pay

## 2021-02-11 DIAGNOSIS — B351 Tinea unguium: Secondary | ICD-10-CM | POA: Diagnosis not present

## 2021-02-11 DIAGNOSIS — M2041 Other hammer toe(s) (acquired), right foot: Secondary | ICD-10-CM | POA: Diagnosis not present

## 2021-02-11 DIAGNOSIS — E119 Type 2 diabetes mellitus without complications: Secondary | ICD-10-CM | POA: Diagnosis not present

## 2021-02-11 DIAGNOSIS — Q828 Other specified congenital malformations of skin: Secondary | ICD-10-CM | POA: Diagnosis not present

## 2021-02-11 DIAGNOSIS — M79675 Pain in left toe(s): Secondary | ICD-10-CM

## 2021-02-11 DIAGNOSIS — M2042 Other hammer toe(s) (acquired), left foot: Secondary | ICD-10-CM | POA: Diagnosis not present

## 2021-02-11 DIAGNOSIS — M79674 Pain in right toe(s): Secondary | ICD-10-CM

## 2021-02-11 NOTE — Progress Notes (Signed)
ANNUAL DIABETIC FOOT EXAM  Subjective: Jessica Baxter presents today for for annual diabetic foot examination.  Patient denies any h/o foot wounds.  Patient denies any numbness, tingling, burning, or pins/needle sensation in feet.  Patient's blood sugar was 98 mg/dl today.  Renford Dills, MD is patient's PCP. Last visit was 11/07/2020.  Past Medical History:  Diagnosis Date   A-fib (HCC)    Chronic atrial fibrillation (HCC)    Diabetes mellitus (HCC)    Essential hypertension    Hypercholesteremia    Obesity    Osteopenia    Patient Active Problem List   Diagnosis Date Noted   Chronic kidney disease, stage 3a (HCC) 02/15/2021   Diabetic renal disease (HCC) 02/15/2021   Gout 02/15/2021   Hypercoagulable state (HCC) 02/15/2021   Anxiety 08/06/2020   Arthritis 08/06/2020   Morbid obesity (HCC) 08/06/2020   Type 2 diabetes mellitus with other specified complication (HCC) 08/06/2020   Osteoarthritis of left knee 02/14/2019   Excess weight 03/06/2018   Hypertension    Hypercholesteremia    Chronic atrial fibrillation (HCC)    Overweight(278.02)    Osteopenia    Impaired fasting glucose    DYSPHAGIA 02/03/2009   Past Surgical History:  Procedure Laterality Date   PARTIAL HYSTERECTOMY     Current Outpatient Medications on File Prior to Visit  Medication Sig Dispense Refill   albuterol (PROVENTIL HFA;VENTOLIN HFA) 108 (90 Base) MCG/ACT inhaler Inhale 2 puffs into the lungs every 4 (four) hours as needed for wheezing or shortness of breath (cough, shortness of breath or wheezing.). 1 Inhaler 1   ALPRAZolam (XANAX) 0.25 MG tablet Take 0.25 mg by mouth daily as needed for anxiety.   3   apixaban (ELIQUIS) 5 MG TABS tablet TAKE 1 TABLET BY MOUTH TWICE A DAY 180 tablet 1   clotrimazole-betamethasone (LOTRISONE) cream 1 application     diltiazem (CARDIZEM CD) 240 MG 24 hr capsule Take 1 capsule (240 mg total) by mouth daily. 90 capsule 3   ezetimibe (ZETIA) 10 MG tablet Take  10 mg by mouth daily.     furosemide (LASIX) 20 MG tablet Take 20 mg by mouth daily.  6   losartan (COZAAR) 100 MG tablet Take 100 mg by mouth daily.     MITIGARE 0.6 MG CAPS      Omega-3 Fatty Acids (FISH OIL PO) Take by mouth as directed.      potassium chloride SA (KLOR-CON M20) 20 MEQ tablet Take 1 tablet (20 mEq total) by mouth daily. 90 tablet 3   Red Yeast Rice Extract (RED YEAST RICE PO) Take 1 tablet by mouth daily.     No current facility-administered medications on file prior to visit.    No Known Allergies Social History   Occupational History   Not on file  Tobacco Use   Smoking status: Never   Smokeless tobacco: Never  Substance and Sexual Activity   Alcohol use: No   Drug use: No   Sexual activity: Not on file   Family History  Problem Relation Age of Onset   Hypertension Mother    Stroke Brother    Heart attack Neg Hx     There is no immunization history on file for this patient.   Review of Systems: Negative except as noted in the HPI.   Objective: There were no vitals filed for this visit.  Shilah Hefel is a pleasant 80 y.o. female in NAD. AAO X 3.  Vascular Examination: Capillary refill time to  digits immediate b/l. Palpable DP pulse(s) b/l LE. Palpable PT pulse(s) b/l LE. Pedal hair sparse. No pain with calf compression b/l. Trace edema noted b/l feet. Nonpitting edema noted bilateral ankles. No cyanosis or clubbing noted b/l LE.  Dermatological Examination: Pedal integument with normal turgor, texture and tone BLE. No open wounds b/l LE. No interdigital macerations noted b/l LE. Toenails 1-5 b/l well maintained with adequate length. No erythema, no edema, no drainage, no fluctuance. Porokeratotic lesion(s) submet head 2 b/l. No erythema, no edema, no drainage, no fluctuance.  Musculoskeletal Examination: Muscle strength 5/5 to all lower extremity muscle groups bilaterally. No pain, crepitus or joint limitation noted with ROM bilateral LE.  Hammertoe(s) noted to the L 4th toe, L 5th toe, R 4th toe, and R 5th toe.  Footwear Assessment: Does the patient wear appropriate shoes? Yes. Does the patient need inserts/orthotics? Yes.  Neurological Examination: Protective sensation intact 5/5 intact bilaterally with 10g monofilament b/l. Vibratory sensation intact b/l.  Assessment: 1. Pain due to onychomycosis of toenails of both feet   2. Porokeratosis   3. Acquired hammertoes of both feet   4. Controlled type 2 diabetes mellitus without complication, without long-term current use of insulin ([place])   5. Encounter for diabetic foot exam ([place])      ADA Risk Categorization: Low Risk :  Patient has all of the following: Intact protective sensation No prior foot ulcer  No severe deformity Pedal pulses present  Plan: -Diabetic foot examination performed today. -Continue foot and shoe inspections daily. Monitor blood glucose per PCP/Endocrinologist's recommendations. -Mycotic toenails 1-5 bilaterally were debrided in length and girth with sterile nail nippers and dremel without incident. -Painful porokeratotic lesion(s) submet head 2 b/l pared and enucleated with sterile scalpel blade without incident. Total number of lesions debrided=2. -Patient/POA to call should there be question/concern in the interim.  Return in about 3 months (around 05/12/2021).  Marzetta Board, DPM

## 2021-02-15 DIAGNOSIS — M109 Gout, unspecified: Secondary | ICD-10-CM | POA: Insufficient documentation

## 2021-02-15 DIAGNOSIS — D6859 Other primary thrombophilia: Secondary | ICD-10-CM | POA: Insufficient documentation

## 2021-02-15 DIAGNOSIS — N1831 Chronic kidney disease, stage 3a: Secondary | ICD-10-CM | POA: Insufficient documentation

## 2021-02-15 DIAGNOSIS — E1121 Type 2 diabetes mellitus with diabetic nephropathy: Secondary | ICD-10-CM | POA: Insufficient documentation

## 2021-03-02 ENCOUNTER — Other Ambulatory Visit: Payer: Self-pay | Admitting: Cardiovascular Disease

## 2021-03-27 ENCOUNTER — Telehealth: Payer: Self-pay | Admitting: Cardiovascular Disease

## 2021-03-27 ENCOUNTER — Other Ambulatory Visit: Payer: Self-pay | Admitting: *Deleted

## 2021-03-27 MED ORDER — APIXABAN 5 MG PO TABS: ORAL_TABLET | ORAL | 1 refills | Status: DC

## 2021-03-27 NOTE — Telephone Encounter (Signed)
°*  STAT* If patient is at the pharmacy, call can be transferred to refill team.   1. Which medications need to be refilled? (please list name of each medication and dose if known) apixaban (ELIQUIS) 5 MG TABS tablet  2. Which pharmacy/location (including street and city if local pharmacy) is medication to be sent to? [id number] [place] [place] - [address]  3. Do they need a 30 day or 90 day supply? 90 day

## 2021-03-27 NOTE — Telephone Encounter (Signed)
Prescription refill request for Eliquis received. Indication: Afib  Last office visit: 04/24/20 [facility] Cancel)  Scr: 1.13 (11/07/20 via [place])  Age: 81 Weight: 88.9kg  Appropriate dose and refill sent to requested pharmacy.

## 2021-04-15 NOTE — Progress Notes (Signed)
Cardiology Office Note Date:  04/20/2021  Patient ID:  Jessica, Baxter 1940-04-12, MRN [id number] PCP:  Renford Dills, MD  Cardiologist: Dr. Eden Emms   Chief Complaint:  F/u afib   History of Present Illness: Jessica Baxter is a 81 y.o. female with history of chronic atrial fib, HTN, DM (diet-controlled), HLD, intolerance of BB due to fatigue  Rate control/anticoagulation strategy adopted   2D echo 05/15/19 Normal EF mild MR LA 46 mm   She is generally unaware of her atrial fibrillation. No chest pain, palpitations, SOB, syncope or bleeding   She is retired and more sedentary Weight is up and has increased edema  Discussed low sodium diet, she will not wear compression stockings does not improvement with leg elevation and complains about lasix making her urinate all the time   Seen in ER 05/08/19 left leg weakness ? Arthritis CT ? Subacute left thalamic lacunar bland infarct But F/u MRI with no acute abnormality   Has worked out the cost of her Eliqus with co pay card and [employer] plan  BP and HR up She is sedentary and doesn't take good care of herself Her sister is on her about diet and exercise but Lennette doesn't seem to care   Past Medical History:  Diagnosis Date   A-fib (HCC)    Chronic atrial fibrillation (HCC)    Diabetes mellitus (HCC)    Essential hypertension    Hypercholesteremia    Obesity    Osteopenia     Past Surgical History:  Procedure Laterality Date   PARTIAL HYSTERECTOMY      Current Outpatient Medications  Medication Sig Dispense Refill   albuterol (PROVENTIL HFA;VENTOLIN HFA) 108 (90 Base) MCG/ACT inhaler Inhale 2 puffs into the lungs every 4 (four) hours as needed for wheezing or shortness of breath (cough, shortness of breath or wheezing.). 1 Inhaler 1   ALPRAZolam (XANAX) 0.25 MG tablet Take 0.25 mg by mouth daily as needed for anxiety.   3   apixaban (ELIQUIS) 5 MG TABS tablet TAKE 1 TABLET BY MOUTH TWICE A DAY 180 tablet 1    clotrimazole-betamethasone (LOTRISONE) cream 1 application     diltiazem (CARDIZEM CD) 240 MG 24 hr capsule Take 1 capsule (240 mg total) by mouth daily. Please keep upcoming appt. in Feb with Dr. Eden Emms in order to receive further refills. Thank You. 90 capsule 0   ezetimibe (ZETIA) 10 MG tablet Take 10 mg by mouth daily.     furosemide (LASIX) 20 MG tablet Take 20 mg by mouth daily.  6   losartan (COZAAR) 100 MG tablet Take 100 mg by mouth daily.     MITIGARE 0.6 MG CAPS      Omega-3 Fatty Acids (FISH OIL PO) Take by mouth as directed.      potassium chloride SA (KLOR-CON M20) 20 MEQ tablet Take 1 tablet (20 mEq total) by mouth daily. 90 tablet 3   Red Yeast Rice Extract (RED YEAST RICE PO) Take 1 tablet by mouth daily.     No current facility-administered medications for this visit.    Allergies:   Patient has no known allergies.   Social History:  The patient  reports that she has never smoked. She has never used smokeless tobacco. She reports that she does not drink alcohol and does not use drugs.   Family History:  The patient's family history includes Hypertension in her mother; Stroke in her brother.  ROS:  Please see the history of present illness.  Trace ankle edema when swinging or hanging legs that resolves when elevating legs. All other systems are reviewed and otherwise negative.   PHYSICAL EXAM:  VS:  BP (!) 166/82    Pulse (!) 106    Ht 5\' 2"  (1.575 m)    Wt 198 lb (89.8 kg)    SpO2 100%    BMI 36.21 kg/m  BMI: Body mass index is 36.21 kg/m.    Affect appropriate Healthy:  appears stated age HEENT: normal Neck supple with no adenopathy JVP normal no bruits no thyromegaly Lungs clear with no wheezing and good diaphragmatic motion Heart:  S1/S2 no murmur, no rub, gallop or click PMI normal Abdomen: benighn, BS positve, no tenderness, no AAA no bruit.  No HSM or HJR Distal pulses intact with no bruits Plus 2 LE edema some appears to be lymph edema  Neuro  non-focal Skin warm and dry No muscular weakness    EKG:  10/31/18 afib rate 89 nonspecific ST changes 04/20/2021 afib rate 99 nonspecific ST changes 04/20/2021 afib rate 106 PVC nonspecific ST changes    CrCl cannot be calculated (Patient's most recent lab result is older than the maximum 21 days allowed.).   Wt Readings from Last 3 Encounters:  04/20/21 198 lb (89.8 kg)  04/24/20 196 lb (88.9 kg)  04/27/19 194 lb (88 kg)     Other studies reviewed: Echo 04/25/13   ASSESSMENT AND PLAN:  Chronic atrial fib - HR and BP up increase cardizem to 360 mg daily continue eliquis  Essential HTN -  see above continue diuretic and ARB As well  HLD - lipids are being followed by PCP.Using red yeast rice  Obesity - not going to gym or watching her diet  DM-2  Diet controlled continue portion control and low carbs A1c with primary  6.   Edema:  Dependant from diet and weight gain  Component lymphedema Lasix  Increased to 40 mg by Dr 04/27/13 has labs next month on K replacement as well   Disposition: F/u with me in a year  Tests:  None  Changes:  Cardizem increased to 360 mg daily    Current medicines are reviewed at length with the patient today.  The patient did not have any concerns regarding medicines.  Jessica Baxter

## 2021-04-20 ENCOUNTER — Encounter: Payer: Self-pay | Admitting: Cardiovascular Disease

## 2021-04-20 ENCOUNTER — Ambulatory Visit: Payer: Medicare PPO | Admitting: Cardiovascular Disease

## 2021-04-20 ENCOUNTER — Other Ambulatory Visit: Payer: Self-pay

## 2021-04-20 VITALS — BP 166/82 | HR 106 | Ht 62.0 in | Wt 198.0 lb

## 2021-04-20 DIAGNOSIS — I482 Chronic atrial fibrillation, unspecified: Secondary | ICD-10-CM

## 2021-04-20 DIAGNOSIS — E785 Hyperlipidemia, unspecified: Secondary | ICD-10-CM | POA: Diagnosis not present

## 2021-04-20 DIAGNOSIS — I10 Essential (primary) hypertension: Secondary | ICD-10-CM | POA: Diagnosis not present

## 2021-04-20 MED ORDER — DILTIAZEM HCL ER COATED BEADS 360 MG PO CP24: 360.0000 mg | ORAL_CAPSULE | Freq: Every day | ORAL | 3 refills | Status: DC

## 2021-04-20 NOTE — Patient Instructions (Addendum)
Medication Instructions:  Your physician has recommended you make the following change in your medication:   1-INCREASE Cardizem 360 mg by mouth daily.  *If you need a refill on your cardiac medications before your next appointment, please call your pharmacy*  Lab Work: If you have labs (blood work) drawn today and your tests are completely normal, you will receive your results only by: Jessica Baxter (if you have MyChart) OR A paper copy in the mail If you have any lab test that is abnormal or we need to change your treatment, we will call you to review the results.  Testing/Procedures: None ordered today.  Follow-Up: At [facility], you and your health needs are our priority.  As part of our continuing mission to provide you with exceptional heart care, we have created designated Provider Care Teams.  These Care Teams include your primary Cardiologist (physician) and Advanced Practice Providers (APPs -  Physician Assistants and Nurse Practitioners) who all work together to provide you with the care you need, when you need it.  We recommend signing up for the patient portal called "MyChart".  Sign up information is provided on this After Visit Summary.  MyChart is used to connect with patients for Virtual Visits (Telemedicine).  Patients are able to view lab/test results, encounter notes, upcoming appointments, etc.  Non-urgent messages can be sent to your provider as well.   To learn more about what you can do with MyChart, go to [email or web address].    Your next appointment:   1 year(s)  The format for your next appointment:   In Person  Provider:   Jenkins Rouge, MD {

## 2021-04-21 ENCOUNTER — Other Ambulatory Visit: Payer: Self-pay | Admitting: Internal Medicine

## 2021-04-21 DIAGNOSIS — Z1231 Encounter for screening mammogram for malignant neoplasm of breast: Secondary | ICD-10-CM

## 2021-05-18 ENCOUNTER — Other Ambulatory Visit: Payer: Self-pay

## 2021-05-18 ENCOUNTER — Ambulatory Visit: Payer: Medicare PPO | Admitting: Podiatry

## 2021-05-18 ENCOUNTER — Telehealth: Payer: Self-pay | Admitting: Cardiovascular Disease

## 2021-05-18 ENCOUNTER — Encounter: Payer: Self-pay | Admitting: Podiatry

## 2021-05-18 DIAGNOSIS — M79675 Pain in left toe(s): Secondary | ICD-10-CM | POA: Diagnosis not present

## 2021-05-18 DIAGNOSIS — M2041 Other hammer toe(s) (acquired), right foot: Secondary | ICD-10-CM

## 2021-05-18 DIAGNOSIS — B351 Tinea unguium: Secondary | ICD-10-CM | POA: Diagnosis not present

## 2021-05-18 DIAGNOSIS — M79674 Pain in right toe(s): Secondary | ICD-10-CM | POA: Diagnosis not present

## 2021-05-18 DIAGNOSIS — N1831 Chronic kidney disease, stage 3a: Secondary | ICD-10-CM | POA: Diagnosis not present

## 2021-05-18 DIAGNOSIS — M2042 Other hammer toe(s) (acquired), left foot: Secondary | ICD-10-CM

## 2021-05-18 DIAGNOSIS — Q828 Other specified congenital malformations of skin: Secondary | ICD-10-CM

## 2021-05-18 DIAGNOSIS — E119 Type 2 diabetes mellitus without complications: Secondary | ICD-10-CM | POA: Diagnosis not present

## 2021-05-18 NOTE — Telephone Encounter (Signed)
Per Dr. Eden Emms, okay to go back to last dose, diltiazem 240 mg by mouth daily. ? ?Left message for patient to call back. ?

## 2021-05-18 NOTE — Telephone Encounter (Signed)
Patient calling and complaining of having feelings of vertigo and jitteriness since starting the increased dose of diltiazem of 360 mg. Patient stated that Dramamine helped. Patient stated she stopped taking diltiazem today and yesterday. Patient did not take her BP or HR when she was on diltiazem. Current BP 173/77 and HR 100. Will forward to Dr. Eden Emms for advisement. ?

## 2021-05-18 NOTE — Telephone Encounter (Signed)
Pt c/o medication issue: ? ?1. Name of Medication: diltiazem (CARDIZEM CD) 360 MG 24 hr capsule ? ?2. How are you currently taking this medication (dosage and times per day)? 1 tablet daily ? ?3. Are you having a reaction (difficulty breathing--STAT)? no ? ?4. What is your medication issue? Patient states the medication needs to be decreased, because she has been feeling lightheaded and nervous. She says she took something for vertigo, but it came back so she believes it is caused by the increased dose of diltiazem. She says she did not take it today. ?

## 2021-05-18 NOTE — Telephone Encounter (Signed)
Tried to call patient, no answer.

## 2021-05-18 NOTE — Progress Notes (Signed)
This patient returns to my office for at risk foot care.  This patient requires this care by a professional since this patient will be at risk due to having diabetes and CKD.  This patient is unable to cut nails herself since the patient cannot reach her nails.These nails are painful walking and wearing shoes.  This patient presents for at risk foot care today. She presents to the office with a caregiver. ? ?General Appearance  Alert, conversant and in no acute stress. ? ?Vascular  Dorsalis pedis and posterior tibial  pulses are palpable  bilaterally.  Capillary return is within normal limits  bilaterally. Temperature is within normal limits  bilaterally. ? ?Neurologic  Senn-Weinstein monofilament wire test within normal limits  bilaterally. Muscle power within normal limits bilaterally. ? ?Nails Thick disfigured discolored nails with subungual debris  from hallux to fifth toes bilaterally. No evidence of bacterial infection or drainage bilaterally. ? ?Orthopedic  No limitations of motion  feet .  No crepitus or effusions noted.  No bony pathology or digital deformities noted. ? ?Skin  normotropic skin with no porokeratosis noted bilaterally.  No signs of infections or ulcers noted.   Callus sub 2  B/L.  Distal corn fourth toe right foot. ? ?Onychomycosis  Pain in right toes  Pain in left toes ? ?Consent was obtained for treatment procedures.   Mechanical debridement of nails 1-5  bilaterally performed with a nail nipper.  Filed with dremel without incident.  Debride callus /corn with # 15 blade. ? ? ?Return office visit    3 months                  Told patient to return for periodic foot care and evaluation due to potential at risk complications. ? ? ?Helane Gunther DPM   ?

## 2021-05-20 MED ORDER — DILTIAZEM HCL ER COATED BEADS 240 MG PO CP24: 240.0000 mg | ORAL_CAPSULE | Freq: Every day | ORAL | 3 refills | Status: DC

## 2021-05-20 NOTE — Telephone Encounter (Signed)
Called patient with Dr. Nishan's advisement. Patient verbalized understanding. 

## 2021-05-21 DIAGNOSIS — I480 Paroxysmal atrial fibrillation: Secondary | ICD-10-CM | POA: Diagnosis not present

## 2021-05-21 DIAGNOSIS — D6869 Other thrombophilia: Secondary | ICD-10-CM | POA: Diagnosis not present

## 2021-05-21 DIAGNOSIS — N1831 Chronic kidney disease, stage 3a: Secondary | ICD-10-CM | POA: Diagnosis not present

## 2021-05-21 DIAGNOSIS — R42 Dizziness and giddiness: Secondary | ICD-10-CM | POA: Diagnosis not present

## 2021-05-21 DIAGNOSIS — E1122 Type 2 diabetes mellitus with diabetic chronic kidney disease: Secondary | ICD-10-CM | POA: Diagnosis not present

## 2021-05-21 DIAGNOSIS — I10 Essential (primary) hypertension: Secondary | ICD-10-CM | POA: Diagnosis not present

## 2021-05-21 DIAGNOSIS — M109 Gout, unspecified: Secondary | ICD-10-CM | POA: Diagnosis not present

## 2021-05-21 DIAGNOSIS — E7800 Pure hypercholesterolemia, unspecified: Secondary | ICD-10-CM | POA: Diagnosis not present

## 2021-06-01 ENCOUNTER — Ambulatory Visit
Admission: RE | Admit: 2021-06-01 | Discharge: 2021-06-01 | Disposition: A | Discharge status: Home / Self Care | Payer: Medicare PPO | Source: Ambulatory Visit | Attending: Internal Medicine | Admitting: Internal Medicine

## 2021-06-01 DIAGNOSIS — Z1231 Encounter for screening mammogram for malignant neoplasm of breast: Secondary | ICD-10-CM | POA: Diagnosis not present

## 2021-07-28 DIAGNOSIS — R600 Localized edema: Secondary | ICD-10-CM | POA: Diagnosis not present

## 2021-07-28 DIAGNOSIS — I10 Essential (primary) hypertension: Secondary | ICD-10-CM | POA: Diagnosis not present

## 2021-07-28 DIAGNOSIS — E1122 Type 2 diabetes mellitus with diabetic chronic kidney disease: Secondary | ICD-10-CM | POA: Diagnosis not present

## 2021-07-28 DIAGNOSIS — E663 Overweight: Secondary | ICD-10-CM | POA: Diagnosis not present

## 2021-07-28 DIAGNOSIS — Z1331 Encounter for screening for depression: Secondary | ICD-10-CM | POA: Diagnosis not present

## 2021-07-28 DIAGNOSIS — N1831 Chronic kidney disease, stage 3a: Secondary | ICD-10-CM | POA: Diagnosis not present

## 2021-07-28 DIAGNOSIS — E7800 Pure hypercholesterolemia, unspecified: Secondary | ICD-10-CM | POA: Diagnosis not present

## 2021-07-28 DIAGNOSIS — Z7189 Other specified counseling: Secondary | ICD-10-CM | POA: Diagnosis not present

## 2021-07-28 DIAGNOSIS — F411 Generalized anxiety disorder: Secondary | ICD-10-CM | POA: Diagnosis not present

## 2021-07-28 DIAGNOSIS — Z0000 Encounter for general adult medical examination without abnormal findings: Secondary | ICD-10-CM | POA: Diagnosis not present

## 2021-07-28 DIAGNOSIS — I480 Paroxysmal atrial fibrillation: Secondary | ICD-10-CM | POA: Diagnosis not present

## 2021-08-21 ENCOUNTER — Other Ambulatory Visit: Payer: Self-pay | Admitting: *Deleted

## 2021-08-21 ENCOUNTER — Ambulatory Visit (INDEPENDENT_AMBULATORY_CARE_PROVIDER_SITE_OTHER): Payer: Medicare PPO | Admitting: Podiatry

## 2021-08-21 ENCOUNTER — Encounter: Payer: Self-pay | Admitting: Podiatry

## 2021-08-21 DIAGNOSIS — M79675 Pain in left toe(s): Secondary | ICD-10-CM

## 2021-08-21 DIAGNOSIS — Q828 Other specified congenital malformations of skin: Secondary | ICD-10-CM | POA: Diagnosis not present

## 2021-08-21 DIAGNOSIS — B351 Tinea unguium: Secondary | ICD-10-CM

## 2021-08-21 DIAGNOSIS — E1121 Type 2 diabetes mellitus with diabetic nephropathy: Secondary | ICD-10-CM | POA: Diagnosis not present

## 2021-08-21 DIAGNOSIS — M79674 Pain in right toe(s): Secondary | ICD-10-CM | POA: Diagnosis not present

## 2021-08-21 NOTE — Progress Notes (Signed)
  Subjective:  Patient ID: Jessica Baxter, female    DOB: [birth year],  MRN: [id number]  Jessica Baxter presents to clinic today for at risk foot care. Pt has h/o NIDDM with chronic kidney disease and painful elongated mycotic toenails 1-5 bilaterally which are tender when wearing enclosed shoe gear. Pain is relieved with periodic professional debridement.  Patient states blood glucose was 108 mg/dl today.  Last known HgA1c was 6.0%.  New problem(s): None.   PCP is Renford Dills, MD , and last visit was March, 2023.  No Known Allergies  Review of Systems: Negative except as noted in the HPI.  Objective: No changes noted in today's physical examination. There were no vitals filed for this visit.  Jessica Baxter is a pleasant 81 y.o. female in NAD. AAO X 3.  Vascular Examination: Capillary refill time to digits immediate b/l. Palpable DP pulse(s) b/l LE. Palpable PT pulse(s) b/l LE. Pedal hair sparse. No pain with calf compression b/l. Trace edema noted b/l feet. Nonpitting edema noted bilateral ankles. No cyanosis or clubbing noted b/l LE.  Dermatological Examination: Pedal integument with normal turgor, texture and tone BLE. No open wounds b/l LE. No interdigital macerations noted b/l LE. Toenails 1-5 b/l well maintained with adequate length. No erythema, no edema, no drainage, no fluctuance. Porokeratotic lesion(s) submet head 2 b/l and distal tip of right 4th digit. No erythema, no edema, no drainage, no fluctuance.  Musculoskeletal Examination: Muscle strength 5/5 to all lower extremity muscle groups bilaterally. No pain, crepitus or joint limitation noted with ROM bilateral LE. Hammertoe(s) noted to the L 4th toe, L 5th toe, R 4th toe, and R 5th toe.  Neurological Examination: Protective sensation intact 5/5 intact bilaterally with 10g monofilament b/l. Vibratory sensation intact b/l.  Assessment/Plan: 1. Pain due to onychomycosis of toenails of both feet   2.  Porokeratosis   3. Diabetic nephropathy associated with type 2 diabetes mellitus (HCC)     -Examined patient. -Patient to continue soft, supportive shoe gear daily. -Mycotic toenails 1-5 bilaterally were debrided in length and girth with sterile nail nippers and dremel without incident. -Porokeratotic lesion(s) R 4th toe and submet head 2 b/l pared and enucleated with sterile scalpel blade without incident. Total number of lesions debrided=3. -Patient/POA to call should there be question/concern in the interim.   Return in about 3 months (around 11/21/2021).  Freddie Breech, DPM

## 2021-08-30 ENCOUNTER — Encounter: Payer: Self-pay | Admitting: Podiatry

## 2021-09-09 ENCOUNTER — Other Ambulatory Visit: Payer: Self-pay | Admitting: Cardiovascular Disease

## 2021-09-10 NOTE — Telephone Encounter (Signed)
Prescription refill request for Eliquis received. Indication: Atrial Fib Last office visit: 04/20/21  Burna Forts MD Scr: 1.37 on 05/21/21 Age: 81 Weight: 89.8kg  Nased on above findings Eliquis 5mg  twice daily is the appropriate dose.  Refill approved.

## 2021-09-15 ENCOUNTER — Encounter (HOSPITAL_COMMUNITY): Payer: Self-pay | Admitting: Emergency Medicine

## 2021-09-15 ENCOUNTER — Ambulatory Visit (HOSPITAL_COMMUNITY)
Admission: EM | Admit: 2021-09-15 | Discharge: 2021-09-15 | Disposition: A | Discharge status: Home / Self Care | Payer: Medicare PPO | Attending: Emergency Medicine | Admitting: Emergency Medicine

## 2021-09-15 DIAGNOSIS — U071 COVID-19: Secondary | ICD-10-CM | POA: Insufficient documentation

## 2021-09-15 DIAGNOSIS — R059 Cough, unspecified: Secondary | ICD-10-CM | POA: Insufficient documentation

## 2021-09-15 DIAGNOSIS — J069 Acute upper respiratory infection, unspecified: Secondary | ICD-10-CM | POA: Insufficient documentation

## 2021-09-15 MED ORDER — BENZONATATE 100 MG PO CAPS: 100.0000 mg | ORAL_CAPSULE | Freq: Three times a day (TID) | ORAL | 0 refills | Status: AC

## 2021-09-15 NOTE — ED Provider Notes (Signed)
[facility]    CSN: [id number] Arrival date & time: 09/15/21  1318      History   Chief Complaint Chief Complaint  Patient presents with   Cough    HPI JOEANN HAAR is a 81 y.o. female.   Patient presents with nasal congestion, rhinorrhea and a persistent dry cough for 3 days.  Cough worsened last night interfering with sleep.  Possible sick contacts, endorses she said next to someone sick prior to symptoms beginning.  Has attempted use of Mucinex DM, cough drops and Tylenol which have been ineffective.  Requesting COVID testing.  Denies fever, chills, body aches, shortness of breath, wheezing, ear pain, sore throat.  History of A-fib, diabetes mellitus, hypertension, CKD.  Past Medical History:  Diagnosis Date   A-fib [facility])    Chronic atrial fibrillation (HCC)    Diabetes mellitus ([place])    Essential hypertension    Hypercholesteremia    Obesity    Osteopenia     Patient Active Problem List   Diagnosis Date Noted   Chronic kidney disease, stage 3a ([place]) 02/15/2021   Diabetic renal disease ([place]) 02/15/2021   Gout 02/15/2021   Hypercoagulable state ([place]) 02/15/2021   Anxiety 08/06/2020   Arthritis 08/06/2020   Morbid obesity ([place]) 08/06/2020   Type 2 diabetes mellitus with other specified complication ([place]) [id number]   Osteoarthritis of left knee 02/14/2019   Excess weight 03/06/2018   Hypertension    Hypercholesteremia    Chronic atrial fibrillation ([place])    Overweight(278.02)    Osteopenia    Impaired fasting glucose    DYSPHAGIA 02/03/2009    Past Surgical History:  Procedure Laterality Date   PARTIAL HYSTERECTOMY      OB History   No obstetric history on file.      Home Medications    Prior to Admission medications   Medication Sig Start Date End Date Taking? Authorizing Provider  albuterol (PROVENTIL HFA;VENTOLIN HFA) 108 (90 Base) MCG/ACT inhaler Inhale 2 puffs into the lungs every 4 (four) hours as needed for wheezing or  shortness of breath (cough, shortness of breath or wheezing.). 02/09/18   Robyn Haber, MD  ALPRAZolam Duanne Moron) 0.25 MG tablet Take 0.25 mg by mouth daily as needed for anxiety.  10/29/14   [provider]  clotrimazole-betamethasone (LOTRISONE) cream 1 application 0000000   [provider]  diltiazem (CARDIZEM CD) 240 MG 24 hr capsule Take 1 capsule (240 mg total) by mouth daily. 05/20/21   Josue Hector, MD  ELIQUIS 5 MG TABS tablet TAKE 1 TABLET BY MOUTH TWICE A DAY 09/10/21   Josue Hector, MD  ezetimibe (ZETIA) 10 MG tablet Take 10 mg by mouth daily. 08/31/18   [provider]  furosemide (LASIX) 20 MG tablet Take 20 mg by mouth daily. 11/09/14   [provider]  losartan (COZAAR) 100 MG tablet Take 100 mg by mouth daily.    [provider]  meclizine (ANTIVERT) 25 MG tablet Take 25 mg by mouth every 8 (eight) hours as needed. 05/21/21   [provider]  MITIGARE 0.6 MG CAPS  10/12/19   [provider]  Omega-3 Fatty Acids (FISH OIL PO) Take by mouth as directed.     [provider]  potassium chloride SA (KLOR-CON M20) 20 MEQ tablet Take 1 tablet (20 mEq total) by mouth daily. 04/27/19   Josue Hector, MD  predniSONE (DELTASONE) 10 MG tablet Take 10 mg by mouth daily at 2 PM.  09/14/21   [provider]  Red Yeast Rice Extract (RED YEAST RICE PO) Take 1 tablet by mouth daily.    [provider]    Family History Family History  Problem Relation Age of Onset   Hypertension Mother    Stroke Brother    Heart attack Neg Hx     Social History Social History   Tobacco Use   Smoking status: Never   Smokeless tobacco: Never  Substance Use Topics   Alcohol use: No   Drug use: No     Allergies   Patient has no known allergies.   Review of Systems Review of Systems Defer to HPI    Physical Exam Triage Vital Signs ED Triage Vitals  Enc Vitals Group     BP 09/15/21 1349 (!) 177/78      Pulse Rate 09/15/21 1349 (!) 102     Resp 09/15/21 1349 20     Temp 09/15/21 1349 98.3 F (36.8 C)     Temp Source 09/15/21 1349 Oral     SpO2 09/15/21 1349 97 %     Weight --      Height --      Head Circumference --      Peak Flow --      Pain Score 09/15/21 1346 0     Pain Loc --      Pain Edu? --      Excl. in GC? --    No data found.  Updated Vital Signs BP (!) 177/78 (BP Location: Left Arm)   Pulse (!) 102   Temp 98.3 F (36.8 C) (Oral)   Resp 20   SpO2 97%   Visual Acuity Right Eye Distance:   Left Eye Distance:   Bilateral Distance:    Right Eye Near:   Left Eye Near:    Bilateral Near:     Physical Exam Constitutional:      Appearance: Normal appearance.  HENT:     Head: Normocephalic.     Right Ear: Tympanic membrane, ear canal and external ear normal.     Left Ear: Tympanic membrane, ear canal and external ear normal.     Nose: Congestion and rhinorrhea present.     Mouth/Throat:     Mouth: Mucous membranes are moist.     Pharynx: Oropharynx is clear.  Eyes:     Extraocular Movements: Extraocular movements intact.  Cardiovascular:     Rate and Rhythm: Normal rate and regular rhythm.     Pulses: Normal pulses.     Heart sounds: Normal heart sounds.  Pulmonary:     Effort: Pulmonary effort is normal.     Breath sounds: Normal breath sounds.  Skin:    General: Skin is warm and dry.  Neurological:     Mental Status: She is alert and oriented to person, place, and time. Mental status is at baseline.  Psychiatric:        Mood and Affect: Mood normal.        Behavior: Behavior normal.      UC Treatments / Results  Labs (all labs ordered are listed, but only abnormal results are displayed) Labs Reviewed - No data to display  EKG   Radiology No results found.  Procedures Procedures (including critical care time)  Medications Ordered in UC Medications - No data to display  Initial Impression / Assessment and Plan / UC Course  I have  reviewed the triage vital signs and the nursing notes.  Pertinent labs & imaging results that were available during my care of the patient were reviewed by me and considered in my medical decision making (see chart for details).  Viral URI with cough  Vital signs are stable, patient is in no signs of distress, O2 saturation is 97% on room air, lungs clear to auscultation, COVID test is pending, based on medical history qualifies for antiviral treatment if positive, prescribed Tessalon for additional management of coughing, may continue use of over-the-counter medications as needed, may follow-up with urgent care if symptoms persist or worsen Final Clinical Impressions(s) / UC Diagnoses   Final diagnoses:  None   Discharge Instructions   None    ED Prescriptions   None    PDMP not reviewed this encounter.   Valinda Hoar, NP 09/15/21 1421

## 2021-09-15 NOTE — ED Triage Notes (Signed)
Pt having a dry cough and runny nose since last night.  Reports sat beside a sick person at church Sunday.

## 2021-09-15 NOTE — Discharge Instructions (Signed)
Your symptoms today are most likely being caused by a virus and should steadily improve in time it can take up to 7 to 10 days before you truly start to see a turnaround however things will get better  COVID test pending, you will be notified of positive test results only, if positive you will qualify for antiviral treatment and will be sent in at that time   You may use Tessalon pill every 8 hours as needed for coughing, if dimetapp has helpful you may continue use You can take Tylenol and/or Ibuprofen as needed for fever reduction and pain relief.   For cough: honey 1/2 to 1 teaspoon (you can dilute the honey in water or another fluid).  You can also use guaifenesin and dextromethorphan for cough. You can use a humidifier for chest congestion and cough.  If you don't have a humidifier, you can sit in the bathroom with the hot shower running.      For sore throat: try warm salt water gargles, cepacol lozenges, throat spray, warm tea or water with lemon/honey, popsicles or ice, or OTC cold relief medicine for throat discomfort.   For congestion: take a daily anti-histamine like Zyrtec, Claritin, and a oral decongestant, such as pseudoephedrine.  You can also use Flonase 1-2 sprays in each nostril daily.   It is important to stay hydrated: drink plenty of fluids (water, gatorade/powerade/pedialyte, juices, or teas) to keep your throat moisturized and help further relieve irritation/discomfort.

## 2021-09-16 ENCOUNTER — Telehealth (HOSPITAL_COMMUNITY): Payer: Self-pay | Admitting: Emergency Medicine

## 2021-09-16 LAB — SARS CORONAVIRUS 2 (TAT 6-24 HRS): SARS Coronavirus 2: POSITIVE — AB

## 2021-09-16 MED ORDER — MOLNUPIRAVIR EUA 200MG CAPSULE
4.0000 | ORAL_CAPSULE | Freq: Two times a day (BID) | ORAL | 0 refills | Status: AC
Start: 2021-09-16 — End: 2021-09-21

## 2021-11-19 DIAGNOSIS — E1122 Type 2 diabetes mellitus with diabetic chronic kidney disease: Secondary | ICD-10-CM | POA: Diagnosis not present

## 2021-11-19 DIAGNOSIS — I10 Essential (primary) hypertension: Secondary | ICD-10-CM | POA: Diagnosis not present

## 2021-11-19 DIAGNOSIS — I480 Paroxysmal atrial fibrillation: Secondary | ICD-10-CM | POA: Diagnosis not present

## 2021-11-19 DIAGNOSIS — N1831 Chronic kidney disease, stage 3a: Secondary | ICD-10-CM | POA: Diagnosis not present

## 2021-11-19 DIAGNOSIS — M109 Gout, unspecified: Secondary | ICD-10-CM | POA: Diagnosis not present

## 2021-11-19 DIAGNOSIS — F411 Generalized anxiety disorder: Secondary | ICD-10-CM | POA: Diagnosis not present

## 2021-11-19 DIAGNOSIS — Z23 Encounter for immunization: Secondary | ICD-10-CM | POA: Diagnosis not present

## 2021-11-19 DIAGNOSIS — R269 Unspecified abnormalities of gait and mobility: Secondary | ICD-10-CM | POA: Diagnosis not present

## 2021-11-24 ENCOUNTER — Ambulatory Visit: Payer: Medicare PPO | Admitting: Podiatry

## 2021-11-24 ENCOUNTER — Encounter: Payer: Self-pay | Admitting: Podiatry

## 2021-11-24 DIAGNOSIS — M79674 Pain in right toe(s): Secondary | ICD-10-CM

## 2021-11-24 DIAGNOSIS — B351 Tinea unguium: Secondary | ICD-10-CM

## 2021-11-24 DIAGNOSIS — Q828 Other specified congenital malformations of skin: Secondary | ICD-10-CM | POA: Diagnosis not present

## 2021-11-24 DIAGNOSIS — E1121 Type 2 diabetes mellitus with diabetic nephropathy: Secondary | ICD-10-CM | POA: Diagnosis not present

## 2021-11-24 DIAGNOSIS — M79675 Pain in left toe(s): Secondary | ICD-10-CM | POA: Diagnosis not present

## 2021-11-28 NOTE — Progress Notes (Signed)
  Subjective:  Patient ID: Jessica Baxter, female    DOB: 29-Apr-1940,  MRN: [id number]  Chief Complaint  Patient presents with   Nail Problem    Diabetic foot care BS-did not check today A1C-Do not know PCP-Polite PCP Vst-11/19/21   New problem(s): None.   No Known Allergies  Review of Systems: Negative except as noted in the HPI.  Objective: No changes noted in today's physical examination.  Jessica Baxter is a pleasant 81 y.o. female WD, WN in NAD. AAO x 3.  Vascular Examination: Capillary refill time to digits immediate b/l. Palpable DP pulse(s) b/l LE. Palpable PT pulse(s) b/l LE. Pedal hair sparse. No pain with calf compression b/l. Trace edema noted b/l feet. Nonpitting edema noted bilateral ankles. No cyanosis or clubbing noted b/l LE.  Dermatological Examination: Pedal integument with normal turgor, texture and tone BLE. No open wounds b/l LE. No interdigital macerations noted b/l LE. Toenails 1-5 b/l well maintained with adequate length. No erythema, no edema, no drainage, no fluctuance. Porokeratotic lesion(s) submet head 2 b/l and distal tip of right 4th digit. No erythema, no edema, no drainage, no fluctuance.  Musculoskeletal Examination: Muscle strength 5/5 to all lower extremity muscle groups bilaterally. No pain, crepitus or joint limitation noted with ROM bilateral LE. Hammertoe(s) noted to the L 4th toe, L 5th toe, R 4th toe, and R 5th toe.  Neurological Examination: Protective sensation intact 5/5 intact bilaterally with 10g monofilament b/l. Vibratory sensation intact b/l. Assessment/Plan: 1. Pain due to onychomycosis of toenails of both feet   2. Porokeratosis   3. Diabetic nephropathy associated with type 2 diabetes mellitus ([place])     No orders of the defined types were placed in this encounter.  -Consent given for treatment as described below: -Examined patient. -Patient to continue soft, supportive shoe gear daily. -Toenails 1-5 b/l were  debrided in length and girth with sterile nail nippers and dremel without iatrogenic bleeding.  -Porokeratotic lesion(s) distal tip of right 4th toe and submet head 2 b/l pared and enucleated with sterile currette without incident. Total number of lesions debrided=3. -Patient/POA to call should there be question/concern in the interim.   Return in about 3 months (around 02/23/2022).  Marzetta Board, DPM

## 2021-12-11 DIAGNOSIS — D649 Anemia, unspecified: Secondary | ICD-10-CM | POA: Diagnosis not present

## 2021-12-18 DIAGNOSIS — Z23 Encounter for immunization: Secondary | ICD-10-CM | POA: Diagnosis not present

## 2022-01-11 ENCOUNTER — Other Ambulatory Visit: Payer: Self-pay | Admitting: Cardiovascular Disease

## 2022-01-11 NOTE — Telephone Encounter (Signed)
Prescription refill request for Eliquis received. Indication:afib Last office visit:2/23 Scr:1.2 Age: 81 Weight:89.8 kg  Prescription refilled

## 2022-03-16 ENCOUNTER — Encounter: Payer: Self-pay | Admitting: Podiatry

## 2022-03-16 ENCOUNTER — Ambulatory Visit: Payer: Medicare PPO | Admitting: Podiatry

## 2022-03-16 VITALS — BP 173/69

## 2022-03-16 DIAGNOSIS — M2042 Other hammer toe(s) (acquired), left foot: Secondary | ICD-10-CM

## 2022-03-16 DIAGNOSIS — E1121 Type 2 diabetes mellitus with diabetic nephropathy: Secondary | ICD-10-CM

## 2022-03-16 DIAGNOSIS — B351 Tinea unguium: Secondary | ICD-10-CM

## 2022-03-16 DIAGNOSIS — M2041 Other hammer toe(s) (acquired), right foot: Secondary | ICD-10-CM

## 2022-03-16 DIAGNOSIS — M79675 Pain in left toe(s): Secondary | ICD-10-CM | POA: Diagnosis not present

## 2022-03-16 DIAGNOSIS — Q828 Other specified congenital malformations of skin: Secondary | ICD-10-CM | POA: Diagnosis not present

## 2022-03-16 DIAGNOSIS — M79674 Pain in right toe(s): Secondary | ICD-10-CM | POA: Diagnosis not present

## 2022-03-16 DIAGNOSIS — E119 Type 2 diabetes mellitus without complications: Secondary | ICD-10-CM | POA: Diagnosis not present

## 2022-03-16 NOTE — Progress Notes (Signed)
ANNUAL DIABETIC FOOT EXAM  Subjective: Jessica Baxter presents today for annual diabetic foot examination.  Chief Complaint  Patient presents with   Nail Problem    Prediabetic BS-101 yesterday A1C-6.? PCP-Polite PCP VST-10/2021    Patient confirms h/o diabetes.  Patient relates 3-4 year h/o diabetes.  Patient denies any h/o foot wounds.  Patient denies any numbness, tingling, burning, or pins/needle sensation in feet.  Risk factors: diabetes, history of gout, HTN, CKD, hypercholesterolemia.  [name], MD is patient's PCP.   Past Medical History:  Diagnosis Date   A-fib ([place])    Chronic atrial fibrillation (HCC)    Diabetes mellitus ([place])    Essential hypertension    Hypercholesteremia    Obesity    Osteopenia    Patient Active Problem List   Diagnosis Date Noted   Chronic kidney disease, stage 3a ([place]) 02/15/2021   Diabetic renal disease ([place]) 02/15/2021   Gout 02/15/2021   Hypercoagulable state ([place]) 02/15/2021   Anxiety 08/06/2020   Arthritis 08/06/2020   Morbid obesity ([place]) 08/06/2020   Type 2 diabetes mellitus with other specified complication ([place]) 12/23/8525   Osteoarthritis of left knee 02/14/2019   Excess weight 03/06/2018   Hypertension    Hypercholesteremia    Chronic atrial fibrillation ([place])    Overweight(278.02)    Osteopenia    Impaired fasting glucose    DYSPHAGIA 02/03/2009   Past Surgical History:  Procedure Laterality Date   PARTIAL HYSTERECTOMY     Current Outpatient Medications on File Prior to Visit  Medication Sig Dispense Refill   albuterol (PROVENTIL HFA;VENTOLIN HFA) 108 (90 Base) MCG/ACT inhaler Inhale 2 puffs into the lungs every 4 (four) hours as needed for wheezing or shortness of breath (cough, shortness of breath or wheezing.). 1 Inhaler 1   ALPRAZolam (XANAX) 0.25 MG tablet Take 0.25 mg by mouth daily as needed for anxiety.   3   benzonatate (TESSALON) 100 MG capsule Take 1 capsule (100 mg total) by mouth  every 8 (eight) hours. 21 capsule 0   clotrimazole-betamethasone (LOTRISONE) cream 1 application     diltiazem (CARDIZEM CD) 240 MG 24 hr capsule Take 1 capsule (240 mg total) by mouth daily. 90 capsule 3   ELIQUIS 5 MG TABS tablet TAKE 1 TABLET BY MOUTH TWICE A DAY 180 tablet 1   ezetimibe (ZETIA) 10 MG tablet Take 10 mg by mouth daily.     furosemide (LASIX) 20 MG tablet Take 20 mg by mouth daily.  6   losartan (COZAAR) 100 MG tablet Take 100 mg by mouth daily.     meclizine (ANTIVERT) 25 MG tablet Take 25 mg by mouth every 8 (eight) hours as needed.     MITIGARE 0.6 MG CAPS      Omega-3 Fatty Acids (FISH OIL PO) Take by mouth as directed.      potassium chloride SA (KLOR-CON M20) 20 MEQ tablet Take 1 tablet (20 mEq total) by mouth daily. 90 tablet 3   predniSONE (DELTASONE) 10 MG tablet Take 10 mg by mouth daily at 2 PM.     Red Yeast Rice Extract (RED YEAST RICE PO) Take 1 tablet by mouth daily.     No current facility-administered medications on file prior to visit.    No Known Allergies Social History   Occupational History   Not on file  Tobacco Use   Smoking status: Never   Smokeless tobacco: Never  Substance and Sexual Activity   Alcohol use: No   Drug  use: No   Sexual activity: Not on file   Family History  Problem Relation Age of Onset   Hypertension Mother    Stroke Brother    Heart attack Neg Hx     There is no immunization history on file for this patient.   Review of Systems: Negative except as noted in the HPI.   Objective: Vitals:   03/16/22 1137 03/16/22 1155  BP: (!) 198/79 (!) 173/69   Jessica Baxter is a pleasant 82 y.o. female in NAD. AAO X 3.  Vascular Examination: Capillary refill time immediate b/l. Vascular status intact b/l with palpable pedal pulses. Pedal hair sparse b/l. No pain with calf compression b/l. Skin temperature gradient WNL b/l. Nonpitting edema noted bilateral ankles.  Neurological Examination: Sensation grossly intact  b/l with 10 gram monofilament. Vibratory sensation intact b/l.   Dermatological Examination: Pedal skin with normal turgor, texture and tone b/l.  No open wounds. No interdigital macerations.   Toenails 1-5 b/l thick, discolored, elongated with subungual debris and pain on dorsal palpation.   Porokeratotic lesion(s) R 4th toe and submet head 2 b/l. No erythema, no edema, no drainage, no fluctuance.  Musculoskeletal Examination: Normal muscle strength 5/5 to all lower extremity muscle groups bilaterally. Hammertoe(s) noted to the bilateral 4th toes and bilateral 5th toes.. No pain, crepitus or joint limitation noted with ROM b/l LE.  Patient ambulates independently without assistive aids.  Radiographs: None  Footwear Assessment: Does the patient wear appropriate shoes? Yes. Does the patient need inserts/orthotics? Yes.  ADA Risk Categorization: Low Risk :  Patient has all of the following: Intact protective sensation No prior foot ulcer  No severe deformity Pedal pulses present  Assessment: 1. Pain due to onychomycosis of toenails of both feet   2. Porokeratosis   3. Acquired hammertoes of both feet   4. Diabetic nephropathy associated with type 2 diabetes mellitus ([place])   5. Encounter for diabetic foot exam [facility])     Plan: -Patient was evaluated and treated. All patient's and/or POA's questions/concerns answered on today's visit. -Continue supportive shoe gear daily. -Mycotic toenails 1-5 bilaterally were debrided in length and girth with sterile nail nippers and dremel without incident. -Porokeratotic lesion(s) R 4th toe and submet head 2 b/l pared and enucleated with sterile currette without incident. Total number of lesions debrided=3. -Patient/POA to call should there be question/concern in the interim. Return in about 3 months (around 06/15/2022).  Marzetta Board, DPM

## 2022-03-29 ENCOUNTER — Ambulatory Visit: Payer: Medicare PPO | Admitting: Podiatry

## 2022-03-29 ENCOUNTER — Ambulatory Visit (INDEPENDENT_AMBULATORY_CARE_PROVIDER_SITE_OTHER): Payer: Medicare PPO

## 2022-03-29 DIAGNOSIS — S90859A Superficial foreign body, unspecified foot, initial encounter: Secondary | ICD-10-CM
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
## 2022-04-22 ENCOUNTER — Other Ambulatory Visit: Payer: Self-pay | Admitting: Internal Medicine

## 2022-04-22 DIAGNOSIS — Z1231 Encounter for screening mammogram for malignant neoplasm of breast: Secondary | ICD-10-CM

## 2022-05-04 ENCOUNTER — Other Ambulatory Visit: Payer: Self-pay | Admitting: Cardiovascular Disease

## 2022-05-07 ENCOUNTER — Other Ambulatory Visit: Payer: Self-pay | Admitting: Cardiovascular Disease

## 2022-05-19 NOTE — Progress Notes (Signed)
Cardiology Office Note Date:  05/26/2022  Patient ID:  Jessica Baxter, Jessica Baxter [birth year], MRN [id number] PCP:  [name], MD  Cardiologist: Dr. Johnsie Cancel   Chief Complaint:  F/u afib   History of Present Illness: Jessica Baxter is a 82 y.o. female with history of chronic atrial fib, HTN, DM (diet-controlled), HLD, intolerance of BB due to fatigue  Rate control/anticoagulation strategy adopted   2D echo 05/15/19 Normal EF mild MR LA 46 mm   She is generally unaware of her atrial fibrillation. No chest pain, palpitations, SOB, syncope or bleeding   She is retired and more sedentary Weight is up and has increased edema  Discussed low sodium diet, she will not wear compression stockings does not improvement with leg elevation and complains about lasix making her urinate all the time   Seen in ER 05/08/19 left leg weakness ? Arthritis CT ? Subacute left thalamic lacunar bland infarct But F/u MRI with no acute abnormality   Lymphedema worse, BP and HR up Discussed increasing lasix to 20 mg bid and adding low dose beta blocker   Past Medical History:  Diagnosis Date   A-fib ([place])    Chronic atrial fibrillation (HCC)    Diabetes mellitus ([place])    Essential hypertension    Hypercholesteremia    Obesity    Osteopenia     Past Surgical History:  Procedure Laterality Date   PARTIAL HYSTERECTOMY      Current Outpatient Medications  Medication Sig Dispense Refill   albuterol (PROVENTIL HFA;VENTOLIN HFA) 108 (90 Base) MCG/ACT inhaler Inhale 2 puffs into the lungs every 4 (four) hours as needed for wheezing or shortness of breath (cough, shortness of breath or wheezing.). 1 Inhaler 1   ALPRAZolam (XANAX) 0.25 MG tablet Take 0.25 mg by mouth daily as needed for anxiety.   3   benzonatate (TESSALON) 100 MG capsule Take 1 capsule (100 mg total) by mouth every 8 (eight) hours. 21 capsule 0   clotrimazole-betamethasone (LOTRISONE) cream 1 application     diltiazem (CARDIZEM CD) 240 MG 24  hr capsule Take 1 capsule (240 mg total) by mouth daily. 90 capsule 3   ELIQUIS 5 MG TABS tablet TAKE 1 TABLET BY MOUTH TWICE A DAY 180 tablet 1   ezetimibe (ZETIA) 10 MG tablet Take 10 mg by mouth daily.     furosemide (LASIX) 20 MG tablet TAKE 1 TABLET BY MOUTH EVERY DAY 90 tablet 0   losartan (COZAAR) 100 MG tablet Take 100 mg by mouth daily.     meclizine (ANTIVERT) 25 MG tablet Take 25 mg by mouth every 8 (eight) hours as needed.     MITIGARE 0.6 MG CAPS      Omega-3 Fatty Acids (FISH OIL PO) Take by mouth as directed.      potassium chloride SA (KLOR-CON M20) 20 MEQ tablet Take 1 tablet (20 mEq total) by mouth daily. 90 tablet 3   Red Yeast Rice Extract (RED YEAST RICE PO) Take 1 tablet by mouth daily.     predniSONE (DELTASONE) 10 MG tablet Take 10 mg by mouth daily at 2 PM. (Patient not taking: Reported on 05/26/2022)     No current facility-administered medications for this visit.    Allergies:   Patient has no known allergies.   Social History:  The patient  reports that she has never smoked. She has never used smokeless tobacco. She reports that she does not drink alcohol and does not use drugs.   Family  History:  The patient's family history includes Hypertension in her mother; Stroke in her brother.  ROS:  Please see the history of present illness. Trace ankle edema when swinging or hanging legs that resolves when elevating legs. All other systems are reviewed and otherwise negative.   PHYSICAL EXAM:  VS:  BP (!) 158/82   Pulse (!) 106   Ht 5' 2.5" (1.588 m)   Wt 192 lb (87.1 kg)   SpO2 97%   BMI 34.56 kg/m  BMI: Body mass index is 34.56 kg/m.    Affect appropriate Healthy:  appears stated age 18: normal Neck supple with no adenopathy JVP normal no bruits no thyromegaly Lungs clear with no wheezing and good diaphragmatic motion Heart:  S1/S2 no murmur, no rub, gallop or click PMI normal Abdomen: benighn, BS positve, no tenderness, no AAA no bruit.  No HSM or  HJR Distal pulses intact with no bruits Plus 2 LE edema some appears to be lymph edema  Neuro non-focal Skin warm and dry No muscular weakness    EKG:  05/26/2022 afib rate 106  otherwise normal    CrCl cannot be calculated (Patient's most recent lab result is older than the maximum 21 days allowed.).   Wt Readings from Last 3 Encounters:  05/26/22 192 lb (87.1 kg)  04/20/21 198 lb (89.8 kg)  04/24/20 196 lb (88.9 kg)     Other studies reviewed: Echo 04/25/13   ASSESSMENT AND PLAN:  Chronic atrial fib - did not tolerated cardizem at 360 mg dose back to 240 mg Rate up add Toprol 25 mg daily  HLD - lipids are being followed by PCP.Using red yeast rice  Obesity - not going to gym or watching her diet  DM-2  Diet controlled continue portion control and low carbs A1c with primary  Edema: dependant continue lasix increase to 20 mg bid   Disposition: F/u with me in a year  Tests:  None  Changes:  Toprol 25 mg , Lasix 20 mg bid has f/u with primary in 8 weeks for labs as well    Current medicines are reviewed at length with the patient today.  The patient did not have any concerns regarding medicines.  Jenkins Rouge

## 2022-05-20 DIAGNOSIS — E1122 Type 2 diabetes mellitus with diabetic chronic kidney disease: Secondary | ICD-10-CM | POA: Diagnosis not present

## 2022-05-20 DIAGNOSIS — I10 Essential (primary) hypertension: Secondary | ICD-10-CM | POA: Diagnosis not present

## 2022-05-20 DIAGNOSIS — F411 Generalized anxiety disorder: Secondary | ICD-10-CM | POA: Diagnosis not present

## 2022-05-20 DIAGNOSIS — N1831 Chronic kidney disease, stage 3a: Secondary | ICD-10-CM | POA: Diagnosis not present

## 2022-05-20 DIAGNOSIS — D6869 Other thrombophilia: Secondary | ICD-10-CM | POA: Diagnosis not present

## 2022-05-20 DIAGNOSIS — I4891 Unspecified atrial fibrillation: Secondary | ICD-10-CM | POA: Diagnosis not present

## 2022-05-20 DIAGNOSIS — I480 Paroxysmal atrial fibrillation: Secondary | ICD-10-CM | POA: Diagnosis not present

## 2022-05-26 ENCOUNTER — Ambulatory Visit: Payer: Medicare PPO | Attending: Cardiovascular Disease | Admitting: Cardiovascular Disease

## 2022-05-26 ENCOUNTER — Encounter: Payer: Self-pay | Admitting: Cardiovascular Disease

## 2022-05-26 VITALS — BP 158/82 | HR 106 | Ht 62.5 in | Wt 192.0 lb

## 2022-05-26 DIAGNOSIS — I482 Chronic atrial fibrillation, unspecified: Secondary | ICD-10-CM

## 2022-05-26 DIAGNOSIS — E785 Hyperlipidemia, unspecified: Secondary | ICD-10-CM | POA: Diagnosis not present

## 2022-05-26 DIAGNOSIS — I10 Essential (primary) hypertension: Secondary | ICD-10-CM | POA: Diagnosis not present

## 2022-05-26 MED ORDER — METOPROLOL SUCCINATE ER 25 MG PO TB24
25.0000 mg | ORAL_TABLET | Freq: Every day | ORAL | 3 refills | Status: DC
Start: 2022-05-26 — End: 2023-06-06

## 2022-05-26 MED ORDER — FUROSEMIDE 20 MG PO TABS: 20.0000 mg | ORAL_TABLET | Freq: Two times a day (BID) | ORAL | 3 refills | Status: DC

## 2022-05-26 NOTE — Patient Instructions (Addendum)
Medication Instructions:  Your physician recommends that you continue on your current medications as directed. Please refer to the Current Medication list given to you today. 1-START Toprol 25 mg by mouth daily 2-INCREASE Lasix 20 mg by mouth twice daily.  Please take your Toprol in the morning, Cardizem and Lasix for lunch, and then Lasix at night.   *If you need a refill on your cardiac medications before your next appointment, please call your pharmacy*  Lab Work: If you have labs (blood work) drawn today and your tests are completely normal, you will receive your results only by: [place] (if you have MyChart) OR A paper copy in the mail If you have any lab test that is abnormal or we need to change your treatment, we will call you to review the results.  Testing/Procedures: None ordered today.  Follow-Up: At [facility], you and your health needs are our priority.  As part of our continuing mission to provide you with exceptional heart care, we have created designated Provider Care Teams.  These Care Teams include your primary Cardiologist (physician) and Advanced Practice Providers (APPs -  Physician Assistants and Nurse Practitioners) who all work together to provide you with the care you need, when you need it.  We recommend signing up for the patient portal called "MyChart".  Sign up information is provided on this After Visit Summary.  MyChart is used to connect with patients for Virtual Visits (Telemedicine).  Patients are able to view lab/test results, encounter notes, upcoming appointments, etc.  Non-urgent messages can be sent to your provider as well.   To learn more about what you can do with MyChart, go to [email or web address].    Your next appointment:   1 year(s)  Provider:   Jenkins Rouge, MD

## 2022-06-07 ENCOUNTER — Ambulatory Visit
Admission: RE | Admit: 2022-06-07 | Discharge: 2022-06-07 | Disposition: A | Discharge status: Home / Self Care | Payer: Medicare PPO | Source: Ambulatory Visit | Attending: Internal Medicine | Admitting: Internal Medicine

## 2022-06-07 DIAGNOSIS — Z1231 Encounter for screening mammogram for malignant neoplasm of breast: Secondary | ICD-10-CM

## 2022-06-10 LAB — AMB RESULTS CONSOLE CBG: Glucose: 160

## 2022-06-10 NOTE — Progress Notes (Signed)
Patient did not present any SDOH insecurities at this time.

## 2022-06-12 ENCOUNTER — Other Ambulatory Visit: Payer: Self-pay | Admitting: Cardiovascular Disease

## 2022-06-17 ENCOUNTER — Encounter: Payer: Self-pay | Admitting: *Deleted

## 2022-06-17 NOTE — Progress Notes (Signed)
Pt seen at 06/10/22 screening event where screening b/p was wnl and blood sugar was 160, non-fasting. Pt did not identify any SDOH insecurities at the event and confirmed Dr. Renford Dills as her PCP . Chart revieiw indicates that pt does have diabetes and last A1C was 6.3 on 05/20/22 at last PCP visit. Also per chart review, pt has ongoing cardiology and podiatry follow up. No additional health equity team support indicated at this time.

## 2022-06-29 ENCOUNTER — Ambulatory Visit: Payer: Medicare PPO | Admitting: Podiatry

## 2022-06-29 DIAGNOSIS — Q828 Other specified congenital malformations of skin: Secondary | ICD-10-CM

## 2022-06-29 DIAGNOSIS — B351 Tinea unguium: Secondary | ICD-10-CM

## 2022-06-29 DIAGNOSIS — M79675 Pain in left toe(s): Secondary | ICD-10-CM | POA: Diagnosis not present

## 2022-06-29 DIAGNOSIS — E1121 Type 2 diabetes mellitus with diabetic nephropathy: Secondary | ICD-10-CM

## 2022-06-29 DIAGNOSIS — M79674 Pain in right toe(s): Secondary | ICD-10-CM

## 2022-07-01 ENCOUNTER — Encounter: Payer: Self-pay | Admitting: Podiatry

## 2022-07-01 NOTE — Progress Notes (Signed)
  Subjective:  Patient ID: Rosezella Florida, female    DOB: 07/26/1940,  MRN: [id number]  Rosezella Florida presents to clinic today for at risk foot care. Pt has h/o NIDDM with chronic kidney disease  Chief Complaint  Patient presents with   Nail Problem and painful porokeratoses bilaterally    [facility] BS-did not check today A1C-6.? PCP-Polite PCP VST-03/22024   New problem(s): None.   PCP is Renford Dills, MD.  No Known Allergies  Review of Systems: Negative except as noted in the HPI.  Objective: No changes noted in today's physical examination. There were no vitals filed for this visit. VANIA ROSERO is a pleasant 82 y.o. female in NAD. AAO x 3.  Vascular Examination: Capillary refill time immediate b/l. Vascular status intact b/l with palpable pedal pulses. Pedal hair sparse b/l. No pain with calf compression b/l. Skin temperature gradient WNL b/l. Nonpitting edema noted bilateral ankles.  Neurological Examination: Sensation grossly intact b/l with 10 gram monofilament. Vibratory sensation intact b/l.   Dermatological Examination: Pedal skin with normal turgor, texture and tone b/l.  No open wounds. No interdigital macerations.   Toenails 1-5 b/l thick, discolored, elongated with subungual debris and pain on dorsal palpation.   Porokeratotic lesion(s) R 4th toe and submet head 2 b/l. No erythema, no edema, no drainage, no fluctuance.  Musculoskeletal Examination: Normal muscle strength 5/5 to all lower extremity muscle groups bilaterally. Hammertoe(s) noted to the bilateral 4th toes and bilateral 5th toes.. No pain, crepitus or joint limitation noted with ROM b/l LE.  Patient ambulates independently without assistive aids.  Radiographs: None Assessment/Plan: 1. Pain due to onychomycosis of toenails of both feet   2. Porokeratosis   3. Diabetic nephropathy associated with type 2 diabetes mellitus (HCC)    -Consent given for treatment as described  below: -Examined patient. -Patient to continue soft, supportive shoe gear daily. -Toenails 1-5 b/l were debrided in length and girth with sterile nail nippers and dremel without iatrogenic bleeding.  -Porokeratotic lesion(s) right fourth digit and submet head 2 b/l pared and enucleated with sterile currette without incident. Total number of lesions debrided=3. -Patient/POA to call should there be question/concern in the interim.   Return in about 3 months (around 09/28/2022).  Freddie Breech, DPM

## 2022-08-13 ENCOUNTER — Other Ambulatory Visit: Payer: Self-pay | Admitting: Cardiovascular Disease

## 2022-09-14 DIAGNOSIS — Z0000 Encounter for general adult medical examination without abnormal findings: Secondary | ICD-10-CM | POA: Diagnosis not present

## 2022-09-14 DIAGNOSIS — N1831 Chronic kidney disease, stage 3a: Secondary | ICD-10-CM | POA: Diagnosis not present

## 2022-09-14 DIAGNOSIS — Z1331 Encounter for screening for depression: Secondary | ICD-10-CM | POA: Diagnosis not present

## 2022-09-14 DIAGNOSIS — I480 Paroxysmal atrial fibrillation: Secondary | ICD-10-CM | POA: Diagnosis not present

## 2022-09-14 DIAGNOSIS — I10 Essential (primary) hypertension: Secondary | ICD-10-CM | POA: Diagnosis not present

## 2022-09-14 DIAGNOSIS — D6869 Other thrombophilia: Secondary | ICD-10-CM | POA: Diagnosis not present

## 2022-09-14 DIAGNOSIS — E7800 Pure hypercholesterolemia, unspecified: Secondary | ICD-10-CM | POA: Diagnosis not present

## 2022-09-14 DIAGNOSIS — E1122 Type 2 diabetes mellitus with diabetic chronic kidney disease: Secondary | ICD-10-CM | POA: Diagnosis not present

## 2022-09-14 DIAGNOSIS — Z23 Encounter for immunization: Secondary | ICD-10-CM | POA: Diagnosis not present

## 2022-10-12 ENCOUNTER — Ambulatory Visit: Payer: Medicare PPO | Admitting: Podiatry

## 2022-10-12 DIAGNOSIS — E1151 Type 2 diabetes mellitus with diabetic peripheral angiopathy without gangrene: Secondary | ICD-10-CM | POA: Diagnosis not present

## 2022-10-12 DIAGNOSIS — B351 Tinea unguium: Secondary | ICD-10-CM

## 2022-10-12 DIAGNOSIS — L84 Corns and callosities: Secondary | ICD-10-CM

## 2022-10-12 NOTE — Progress Notes (Signed)
    Subjective:  Patient ID: Jessica Baxter, female    DOB: [birth year],  MRN: [id number]  Jessica Baxter presents to clinic today for:  Chief Complaint  Patient presents with   Diabetes    [facility] BS - 93 A COUPLE DAYS AGO, DONT CHECK IT EVERYDAY A1C - 6  . Patient presents for diabetic footcare and corns/calluses.  Denies numbness in the feet.  States that she does not check her blood sugar every day.  She states her last HbA1c level was 6.  She has seen her PCP within the last 6 months.  She usually uses a cane to assist with ambulation but states that she left in the car today.  PCP is Renford Dills, MD. date last seen was 09/17/2022  No Known Allergies  Review of Systems: Negative except as noted in the HPI.  Objective:  There were no vitals filed for this visit.  SETSUKO KIPLINGER is a pleasant 82 y.o. female in NAD. AAO x 3.  Vascular Examination: Patient has palpable DP pulse, absent PT pulse bilateral.  Delayed capillary refill bilateral toes.  Sparse digital hair bilateral.  Proximal to distal cooling WNL bilateral.  +1 pitting edema bilateral lower legs and ankles.  Dermatological Examination: Interspaces are clear with no open lesions noted bilateral.  Nails are 3-44mm thick, with yellowish/brown discoloration, subungual debris and distal onycholysis x10.  There is pain with compression of nails x10.  There are hyperkeratotic lesions noted bilateral submet 2, and distal right fourth toe.  No soft tissue breakdown is noted  Neurological Examination: Protective sensation intact b/l LE.   Musculoskeletal Examination: Muscle strength 5/5 to all LE muscle groups b/l.  Semiflexible hammertoe contractures are noted of the lesser toes at the PIP joints  Patient qualifies for at-risk foot care because of diabetes with mild PVD.  Assessment/Plan: 1. Dermatophytosis of nail   2. Type II diabetes mellitus with peripheral circulatory disorder (HCC)   3. Corns     Mycotic  nails x10 were sharply debrided with sterile nail nippers and power debriding burr to decrease bulk and length.  Hyperkeratotic lesions x 3 were shaved with #312 blade.  Two of the lesions are proximal to the DIPJ.  Return in about 3 months (around 01/12/2023) for [facility] (she already says she has an appt).   Clerance Lav, DPM, FACFAS [facility]     [address] [place], [place] [address]                Office [phone]  Fax [phone]

## 2022-10-12 NOTE — Progress Notes (Signed)
Cardiology Office Note Date:  10/20/2022  Patient ID:  Jessica, Baxter [birth year], MRN [id number] PCP:  Renford Dills, MD  Cardiologist: Dr. Eden Emms   Chief Complaint:  F/u afib   History of Present Illness: Jessica Baxter is a 82 y.o. female with history of chronic atrial fib, HTN, DM (diet-controlled), HLD, intolerance of BB due to fatigue  Rate control/anticoagulation strategy adopted   2D echo 05/15/19 Normal EF mild MR LA 46 mm   She is generally unaware of her atrial fibrillation. No chest pain, palpitations, SOB, syncope or bleeding   She is retired and more sedentary Weight is up and has increased edema  Discussed low sodium diet, she will not wear compression stockings does not improvement with leg elevation and complains about lasix making her urinate all the time   Seen in ER 05/08/19 left leg weakness ? Arthritis CT ? Subacute left thalamic lacunar bland infarct But F/u MRI with no acute abnormality   05/26/22 Lymphedema worse, BP and HR up Increased llasix to 20 mg bid and added low dose beta blocker   She was focused on a spot on her breast but had benign mammogram in April   Past Medical History:  Diagnosis Date   A-fib [facility])    Chronic atrial fibrillation (HCC)    Diabetes mellitus (HCC)    Essential hypertension    Hypercholesteremia    Obesity    Osteopenia     Past Surgical History:  Procedure Laterality Date   PARTIAL HYSTERECTOMY      Current Outpatient Medications  Medication Sig Dispense Refill   albuterol (PROVENTIL HFA;VENTOLIN HFA) 108 (90 Base) MCG/ACT inhaler Inhale 2 puffs into the lungs every 4 (four) hours as needed for wheezing or shortness of breath (cough, shortness of breath or wheezing.). 1 Inhaler 1   ALPRAZolam (XANAX) 0.25 MG tablet Take 0.25 mg by mouth daily as needed for anxiety.   3   apixaban (ELIQUIS) 5 MG TABS tablet Take 1 tablet (5 mg total) by mouth 2 (two) times daily. 180 tablet 1   benzonatate (TESSALON) 100 MG  capsule Take 1 capsule (100 mg total) by mouth every 8 (eight) hours. 21 capsule 0   clotrimazole-betamethasone (LOTRISONE) cream as needed.     diltiazem (CARDIZEM CD) 240 MG 24 hr capsule Take 1 capsule (240 mg total) by mouth daily. 90 capsule 3   ezetimibe (ZETIA) 10 MG tablet Take 10 mg by mouth daily.     furosemide (LASIX) 20 MG tablet Take 1 tablet (20 mg total) by mouth 2 (two) times daily. 180 tablet 3   losartan (COZAAR) 100 MG tablet Take 100 mg by mouth daily.     meclizine (ANTIVERT) 25 MG tablet Take 25 mg by mouth every 8 (eight) hours as needed.     metoprolol succinate (TOPROL XL) 25 MG 24 hr tablet Take 1 tablet (25 mg total) by mouth daily. 90 tablet 3   MITIGARE 0.6 MG CAPS prn     Omega-3 Fatty Acids (FISH OIL PO) Take by mouth as directed.      predniSONE (DELTASONE) 10 MG tablet Take 10 mg by mouth daily at 2 PM.     potassium chloride SA (KLOR-CON M20) 20 MEQ tablet Take 1 tablet (20 mEq total) by mouth daily. (Patient not taking: Reported on 10/20/2022) 90 tablet 3   Red Yeast Rice Extract (RED YEAST RICE PO) Take 1 tablet by mouth daily. (Patient not taking: Reported on 10/20/2022)  No current facility-administered medications for this visit.    Allergies:   Patient has no known allergies.   Social History:  The patient  reports that she has never smoked. She has never used smokeless tobacco. She reports that she does not drink alcohol and does not use drugs.   Family History:  The patient's family history includes Hypertension in her mother; Stroke in her brother.  ROS:  Please see the history of present illness. Trace ankle edema when swinging or hanging legs that resolves when elevating legs. All other systems are reviewed and otherwise negative.   PHYSICAL EXAM:  VS:  BP 118/76   Pulse 100   Ht 5' 2.5" (1.588 m)   Wt 185 lb 6.4 oz (84.1 kg)   SpO2 99%   BMI 33.37 kg/m  BMI: Body mass index is 33.37 kg/m.    Affect appropriate Healthy:  appears  stated age HEENT: normal Neck supple with no adenopathy JVP normal no bruits no thyromegaly Lungs clear with no wheezing and good diaphragmatic motion Heart:  S1/S2 no murmur, no rub, gallop or click PMI normal Abdomen: benighn, BS positve, no tenderness, no AAA no bruit.  No HSM or HJR Distal pulses intact with no bruits Plus 2 LE edema some appears to be lymph edema  Neuro non-focal Skin warm and dry No muscular weakness   EKG:  10/20/2022 afib rate 106  otherwise normal    CrCl cannot be calculated (Patient's most recent lab result is older than the maximum 21 days allowed.).   Wt Readings from Last 3 Encounters:  10/20/22 185 lb 6.4 oz (84.1 kg)  05/26/22 192 lb (87.1 kg)  04/20/21 198 lb (89.8 kg)     Other studies reviewed: Echo 05/15/19  IMPRESSIONS     1. Left ventricular ejection fraction, by estimation, is 65 to 70%. The  left ventricle has normal function. The left ventricle has no regional  wall motion abnormalities. Left ventricular diastolic parameters are  indeterminate.   2. Right ventricular systolic function is normal. The right ventricular  size is normal. There is moderately elevated pulmonary artery systolic  pressure.   3. Left atrial size was mildly dilated.   4. The mitral valve is normal in structure. Mild mitral valve  regurgitation. No evidence of mitral stenosis.   5. The aortic valve is normal in structure. Aortic valve regurgitation is  not visualized. No aortic stenosis is present.   6. The inferior vena cava is normal in size with greater than 50%  respiratory variability, suggesting right atrial pressure of 3 mmHg.    ASSESSMENT AND PLAN:  Chronic atrial fib - did not tolerated cardizem at 360 mg dose back to 240 mg Rate up add Toprol 25 mg daily EF normal just mild MR on TTE 05/15/19  HLD - lipids are being followed by PCP.Using red yeast rice  Obesity - not going to gym or watching her diet  DM-2  Diet controlled continue portion  control and low carbs A1c with primary  Edema: dependant continue lasix increase to 20 mg bid   Disposition: F/u with me in a year  Tests:  None  Changes:  Toprol 25 mg , Lasix 20 mg bid has f/u with primary     Current medicines are reviewed at length with the patient today.  The patient did not have any concerns regarding medicines.  Charlton Haws

## 2022-10-15 ENCOUNTER — Other Ambulatory Visit: Payer: Self-pay

## 2022-10-15 MED ORDER — APIXABAN 5 MG PO TABS
5.0000 mg | ORAL_TABLET | Freq: Two times a day (BID) | ORAL | 1 refills | Status: DC
Start: 2022-10-15 — End: 2023-05-30

## 2022-10-15 NOTE — Telephone Encounter (Signed)
Prescription refill request for Eliquis received. Indication:afib Last office visit:3/24 Scr:1.4  7/24 Age: 82 Weight:87.1  kg  Prescription refilled

## 2022-10-20 ENCOUNTER — Ambulatory Visit: Payer: Medicare PPO | Attending: Cardiovascular Disease | Admitting: Cardiovascular Disease

## 2022-10-20 ENCOUNTER — Encounter: Payer: Self-pay | Admitting: Cardiovascular Disease

## 2022-10-20 VITALS — BP 118/76 | HR 100 | Ht 62.5 in | Wt 185.4 lb

## 2022-10-20 DIAGNOSIS — I10 Essential (primary) hypertension: Secondary | ICD-10-CM | POA: Diagnosis not present

## 2022-10-20 DIAGNOSIS — I482 Chronic atrial fibrillation, unspecified: Secondary | ICD-10-CM

## 2022-10-20 DIAGNOSIS — E785 Hyperlipidemia, unspecified: Secondary | ICD-10-CM

## 2022-10-20 NOTE — Patient Instructions (Addendum)

## 2022-11-15 DIAGNOSIS — I10 Essential (primary) hypertension: Secondary | ICD-10-CM | POA: Diagnosis not present

## 2022-11-15 DIAGNOSIS — B372 Candidiasis of skin and nail: Secondary | ICD-10-CM | POA: Diagnosis not present

## 2022-11-15 DIAGNOSIS — I480 Paroxysmal atrial fibrillation: Secondary | ICD-10-CM | POA: Diagnosis not present

## 2022-11-15 DIAGNOSIS — R600 Localized edema: Secondary | ICD-10-CM | POA: Diagnosis not present

## 2022-12-10 DIAGNOSIS — Z23 Encounter for immunization: Secondary | ICD-10-CM | POA: Diagnosis not present

## 2023-01-19 ENCOUNTER — Encounter: Payer: Self-pay | Admitting: Podiatry

## 2023-01-19 ENCOUNTER — Ambulatory Visit (INDEPENDENT_AMBULATORY_CARE_PROVIDER_SITE_OTHER): Payer: Medicare PPO | Admitting: Podiatry

## 2023-01-19 VITALS — Ht 62.5 in | Wt 185.4 lb

## 2023-01-19 DIAGNOSIS — Q828 Other specified congenital malformations of skin: Secondary | ICD-10-CM | POA: Diagnosis not present

## 2023-01-19 DIAGNOSIS — B351 Tinea unguium: Secondary | ICD-10-CM

## 2023-01-19 DIAGNOSIS — E1151 Type 2 diabetes mellitus with diabetic peripheral angiopathy without gangrene: Secondary | ICD-10-CM | POA: Diagnosis not present

## 2023-01-19 DIAGNOSIS — M79674 Pain in right toe(s): Secondary | ICD-10-CM | POA: Diagnosis not present

## 2023-01-19 DIAGNOSIS — M79675 Pain in left toe(s): Secondary | ICD-10-CM | POA: Diagnosis not present

## 2023-01-24 NOTE — Progress Notes (Signed)
  Subjective:  Patient ID: Jessica Baxter, female    DOB: Feb 09, 1941,  MRN: [id number]  Jessica Baxter presents to clinic today for at risk foot care. Pt has h/o NIDDM with PAD and painful porokeratotic lesion(s) both feet and painful mycotic toenails that limit ambulation. Painful toenails interfere with ambulation. Aggravating factors include wearing enclosed shoe gear. Pain is relieved with periodic professional debridement. Painful porokeratotic lesions are aggravated when weightbearing with and without shoegear. Pain is relieved with periodic professional debridement.  Chief Complaint  Patient presents with   Nail Problem    Pt is here for [facility], unsure of last A1C PCP is Dr Nehemiah Settle and LOV was in September.   New problem(s): None.   PCP is Renford Dills, MD.  No Known Allergies  Review of Systems: Negative except as noted in the HPI.  Objective:  There were no vitals filed for this visit. Jessica Baxter is a pleasant 82 y.o. female WD, WN in NAD. AAO x 3.   Vascular Examination: Capillary refill time immediate b/l. Vascular status intact b/l with palpable pedal pulses. Pedal hair sparse b/l. No pain with calf compression b/l. Skin temperature gradient WNL b/l. Nonpitting edema noted bilateral ankles.  Neurological Examination: Sensation grossly intact b/l with 10 gram monofilament. Vibratory sensation intact b/l.   Dermatological Examination: Pedal skin with normal turgor, texture and tone b/l.  No open wounds. No interdigital macerations.   Toenails 1-5 b/l thick, discolored, elongated with subungual debris and pain on dorsal palpation.   Porokeratotic lesion(s) submet head 2 b/l. No erythema, no edema, no drainage, no fluctuance.  Musculoskeletal Examination: Normal muscle strength 5/5 to all lower extremity muscle groups bilaterally. Hammertoe(s) noted to the bilateral 4th toes and bilateral 5th toes. No pain, crepitus or joint limitation noted with ROM b/l LE.   Patient ambulates with rollator assistance.  Radiographs: None  Assessment/Plan: 1. Pain due to onychomycosis of toenails of both feet   2. Porokeratosis   3. Type II diabetes mellitus with peripheral circulatory disorder [facility])     Patient was evaluated and treated. All patient's and/or POA's questions/concerns addressed on today's visit. Toenails 1-5 debrided in length and girth without incident. Porokeratotic lesion(s) submet head 2 b/l pared with sharp debridement without incident. Continue soft, supportive shoe gear daily. Report any pedal injuries to medical professional. Call office if there are any questions/concerns. -Continue foot and shoe inspections daily. Monitor blood glucose per PCP/Endocrinologist's recommendations. -Patient/POA to call should there be question/concern in the interim.   Return in about 3 months (around 04/21/2023).  Freddie Breech, DPM      [place] LOCATION: [address] [place] [address]                   Office [phone]   [facility] LOCATION: [address] [place], [place] [address] Office [phone]

## 2023-03-01 DIAGNOSIS — H2513 Age-related nuclear cataract, bilateral: Secondary | ICD-10-CM | POA: Diagnosis not present

## 2023-03-28 DIAGNOSIS — D6869 Other thrombophilia: Secondary | ICD-10-CM | POA: Diagnosis not present

## 2023-03-28 DIAGNOSIS — E1122 Type 2 diabetes mellitus with diabetic chronic kidney disease: Secondary | ICD-10-CM | POA: Diagnosis not present

## 2023-03-28 DIAGNOSIS — E1169 Type 2 diabetes mellitus with other specified complication: Secondary | ICD-10-CM | POA: Diagnosis not present

## 2023-03-28 DIAGNOSIS — I480 Paroxysmal atrial fibrillation: Secondary | ICD-10-CM | POA: Diagnosis not present

## 2023-03-28 DIAGNOSIS — E7800 Pure hypercholesterolemia, unspecified: Secondary | ICD-10-CM | POA: Diagnosis not present

## 2023-03-28 DIAGNOSIS — I10 Essential (primary) hypertension: Secondary | ICD-10-CM | POA: Diagnosis not present

## 2023-03-28 DIAGNOSIS — N1831 Chronic kidney disease, stage 3a: Secondary | ICD-10-CM | POA: Diagnosis not present

## 2023-04-29 ENCOUNTER — Other Ambulatory Visit: Payer: Self-pay | Admitting: Internal Medicine

## 2023-04-29 DIAGNOSIS — Z1231 Encounter for screening mammogram for malignant neoplasm of breast: Secondary | ICD-10-CM

## 2023-05-10 ENCOUNTER — Ambulatory Visit (INDEPENDENT_AMBULATORY_CARE_PROVIDER_SITE_OTHER): Payer: Medicare Other | Admitting: Podiatry

## 2023-05-10 DIAGNOSIS — Z91199 Patient's noncompliance with other medical treatment and regimen due to unspecified reason: Secondary | ICD-10-CM

## 2023-05-10 NOTE — Progress Notes (Signed)
 1. No-show for appointment

## 2023-05-30 ENCOUNTER — Other Ambulatory Visit: Payer: Self-pay

## 2023-05-30 ENCOUNTER — Telehealth: Payer: Self-pay | Admitting: Cardiovascular Disease

## 2023-05-30 DIAGNOSIS — I482 Chronic atrial fibrillation, unspecified: Secondary | ICD-10-CM

## 2023-05-30 MED ORDER — APIXABAN 5 MG PO TABS: 5.0000 mg | ORAL_TABLET | Freq: Two times a day (BID) | ORAL | 0 refills | Status: DC

## 2023-05-30 NOTE — Telephone Encounter (Deleted)
 Prescription refill request for Eliquis received. Indication: AF Last office visit: 10/20/22  Burna Forts MD Scr: Age: 84 Weight: 84.1kg

## 2023-05-30 NOTE — Telephone Encounter (Signed)
 Already responded to by another provider.

## 2023-05-30 NOTE — Telephone Encounter (Signed)
 Prescription refill request for Eliquis received. Indication:afib Last office visit:8/24 YQM:VHQIO labs Age: 83 Weight:84.1  kg  Prescription refilled

## 2023-05-30 NOTE — Telephone Encounter (Signed)
*  STAT* If patient is at the pharmacy, call can be transferred to refill team.   1. Which medications need to be refilled? (please list name of each medication and dose if known)   apixaban (ELIQUIS) 5 MG TABS tablet     2. Would you like to learn more about the convenience, safety, & potential cost savings by using the [facility]? No    3. Are you open to using the [facility] (Type [facility].  ). No   4. Which pharmacy/location (including street and city if local pharmacy) is medication to be sent to? [id number] - [place], [place] - [address]    5. Do they need a 30 day or 90 day supply? 90 day  Pt is out of medication

## 2023-06-05 ENCOUNTER — Other Ambulatory Visit: Payer: Self-pay | Admitting: Cardiovascular Disease

## 2023-06-08 ENCOUNTER — Encounter: Payer: Self-pay | Admitting: Podiatry

## 2023-06-08 ENCOUNTER — Ambulatory Visit: Admitting: Podiatry

## 2023-06-08 VITALS — Ht 62.5 in | Wt 185.4 lb

## 2023-06-08 DIAGNOSIS — E119 Type 2 diabetes mellitus without complications: Secondary | ICD-10-CM

## 2023-06-08 DIAGNOSIS — M2041 Other hammer toe(s) (acquired), right foot: Secondary | ICD-10-CM

## 2023-06-08 DIAGNOSIS — M79674 Pain in right toe(s): Secondary | ICD-10-CM

## 2023-06-08 DIAGNOSIS — B351 Tinea unguium: Secondary | ICD-10-CM | POA: Diagnosis not present

## 2023-06-08 DIAGNOSIS — E1151 Type 2 diabetes mellitus with diabetic peripheral angiopathy without gangrene: Secondary | ICD-10-CM | POA: Diagnosis not present

## 2023-06-08 DIAGNOSIS — M79675 Pain in left toe(s): Secondary | ICD-10-CM

## 2023-06-08 DIAGNOSIS — M2042 Other hammer toe(s) (acquired), left foot: Secondary | ICD-10-CM

## 2023-06-09 ENCOUNTER — Other Ambulatory Visit: Payer: Self-pay | Admitting: Internal Medicine

## 2023-06-09 ENCOUNTER — Ambulatory Visit
Admission: RE | Admit: 2023-06-09 | Discharge: 2023-06-09 | Disposition: A | Discharge status: Home / Self Care | Payer: Medicare PPO | Source: Ambulatory Visit | Attending: Internal Medicine | Admitting: Internal Medicine

## 2023-06-09 DIAGNOSIS — Z1231 Encounter for screening mammogram for malignant neoplasm of breast: Secondary | ICD-10-CM

## 2023-06-09 DIAGNOSIS — N61 Mastitis without abscess: Secondary | ICD-10-CM

## 2023-06-09 DIAGNOSIS — B372 Candidiasis of skin and nail: Secondary | ICD-10-CM | POA: Diagnosis not present

## 2023-06-12 ENCOUNTER — Encounter: Payer: Self-pay | Admitting: Podiatry

## 2023-06-12 NOTE — Progress Notes (Signed)
 ANNUAL DIABETIC FOOT EXAM  Subjective: Jessica Baxter presents today for annual diabetic foot exam.  Chief Complaint  Patient presents with   Nail Problem    Pt is here for [facility] unsure of last A1C PCP is Dr Joice Nares and LOV was in January.   Patient confirms h/o diabetes.  Patient denies any h/o foot wounds.  Merl Star, MD is patient's PCP.  Past Medical History:  Diagnosis Date   A-fib (HCC)    Chronic atrial fibrillation (HCC)    Diabetes mellitus (HCC)    Essential hypertension    Hypercholesteremia    Obesity    Osteopenia    Patient Active Problem List   Diagnosis Date Noted   Chronic kidney disease, stage 3a (HCC) 02/15/2021   Diabetic renal disease (HCC) 02/15/2021   Gout 02/15/2021   Hypercoagulable state (HCC) 02/15/2021   Anxiety 08/06/2020   Arthritis 08/06/2020   Morbid obesity (HCC) 08/06/2020   Type 2 diabetes mellitus with other specified complication (HCC) 08/06/2020   Osteoarthritis of left knee 02/14/2019   Excess weight 03/06/2018   Hypertension    Hypercholesteremia    Chronic atrial fibrillation (HCC)    Overweight    Osteopenia    Impaired fasting glucose    DYSPHAGIA 02/03/2009   Past Surgical History:  Procedure Laterality Date   PARTIAL HYSTERECTOMY     Current Outpatient Medications on File Prior to Visit  Medication Sig Dispense Refill   albuterol (PROVENTIL HFA;VENTOLIN HFA) 108 (90 Base) MCG/ACT inhaler Inhale 2 puffs into the lungs every 4 (four) hours as needed for wheezing or shortness of breath (cough, shortness of breath or wheezing.). 1 Inhaler 1   ALPRAZolam (XANAX) 0.25 MG tablet Take 0.25 mg by mouth daily as needed for anxiety.   3   apixaban (ELIQUIS) 5 MG TABS tablet Take 1 tablet (5 mg total) by mouth 2 (two) times daily. NEEDS LABS FOR ELIQUIS REFILLS, COME TO OFFICE 60 tablet 0   benzonatate (TESSALON) 100 MG capsule Take 1 capsule (100 mg total) by mouth every 8 (eight) hours. 21 capsule 0    clotrimazole-betamethasone (LOTRISONE) cream as needed.     diltiazem (CARDIZEM CD) 240 MG 24 hr capsule Take 1 capsule (240 mg total) by mouth daily. 90 capsule 3   ezetimibe (ZETIA) 10 MG tablet Take 10 mg by mouth daily.     furosemide (LASIX) 20 MG tablet Take 1 tablet (20 mg total) by mouth 2 (two) times daily. 180 tablet 3   losartan (COZAAR) 100 MG tablet Take 100 mg by mouth daily.     meclizine (ANTIVERT) 25 MG tablet Take 25 mg by mouth every 8 (eight) hours as needed.     metoprolol succinate (TOPROL-XL) 25 MG 24 hr tablet TAKE 1 TABLET (25 MG TOTAL) BY MOUTH DAILY. 90 tablet 1   MITIGARE 0.6 MG CAPS prn     Omega-3 Fatty Acids (FISH OIL PO) Take by mouth as directed.      potassium chloride SA (KLOR-CON M20) 20 MEQ tablet Take 1 tablet (20 mEq total) by mouth daily. 90 tablet 3   predniSONE (DELTASONE) 10 MG tablet Take 10 mg by mouth daily at 2 PM.     Red Yeast Rice Extract (RED YEAST RICE PO) Take 1 tablet by mouth daily.     No current facility-administered medications on file prior to visit.    No Known Allergies Social History   Occupational History   Not on file  Tobacco Use  Smoking status: Never   Smokeless tobacco: Never  Substance and Sexual Activity   Alcohol use: No   Drug use: No   Sexual activity: Not on file   Family History  Problem Relation Age of Onset   Hypertension Mother    Stroke Brother    Heart attack Neg Hx     There is no immunization history on file for this patient.   Review of Systems: Negative except as noted in the HPI.   Objective: There were no vitals filed for this visit.  Jessica Baxter is a pleasant 83 y.o. female in NAD. AAO X 3.  Diabetic foot exam was performed with the following findings:   Normal sensation of 10g monofilament Vascular Examination: Capillary refill time immediate b/l. Vascular status intact b/l with palpable DP pulses; faintly palpable PT pulses. Pedal hair absent b/l.No pain with calf  compression b/l. Skin temperature gradient WNL b/l. No cyanosis or clubbing noted b/l. No ischemia or gangrene b/l. +1 pitting edema noted BLE.  Neurological Examination: Sensation grossly intact b/l with 10 gram monofilament. Vibratory sensation intact b/l.   Dermatological Examination: Pedal skin with normal turgor, texture and tone b/l.  No open wounds. No interdigital macerations.   Toenails 1-5 b/l thick, discolored, elongated with subungual debris and pain on dorsal palpation.   No hyperkeratotic nor porokeratotic lesions present on today's visit.  Musculoskeletal Examination: Muscle strength 5/5 to all lower extremity muscle groups bilaterally. Hammertoe(s) L 4th toe, L 5th toe, R 4th toe, and R 5th toe.  Radiographs: None    ADA Risk Categorization: Low Risk :  Patient has all of the following: Intact protective sensation No prior foot ulcer  No severe deformity Pedal pulses present  Assessment: 1. Pain due to onychomycosis of toenails of both feet   2. Acquired hammertoes of both feet   3. Type II diabetes mellitus with peripheral circulatory disorder (HCC)   4. Encounter for diabetic foot exam (HCC)     Plan: Diabetic foot examination performed today.  All patient's and/or POA's questions/concerns addressed on today's visit. Mycotic toenails 1-5 debrided in length and girth without incident. Continue daily foot inspections and monitor blood glucose per PCP/Endocrinologist's recommendations. Continue soft, supportive shoe gear daily. Report any pedal injuries to medical professional. Call office if there are any questions/concerns. -Patient/POA to call should there be question/concern in the interim. No follow-ups on file.  Luella Sager, DPM      [place] LOCATION: [address], [place] [address]                   Office [phone]   [facility] LOCATION: [address] [place], [place]  [address] Office [phone]

## 2023-06-16 DIAGNOSIS — B372 Candidiasis of skin and nail: Secondary | ICD-10-CM | POA: Diagnosis not present

## 2023-06-16 DIAGNOSIS — R269 Unspecified abnormalities of gait and mobility: Secondary | ICD-10-CM | POA: Diagnosis not present

## 2023-07-11 ENCOUNTER — Other Ambulatory Visit: Payer: Self-pay | Admitting: Cardiovascular Disease

## 2023-07-11 ENCOUNTER — Telehealth: Payer: Self-pay | Admitting: Cardiovascular Disease

## 2023-07-11 NOTE — Telephone Encounter (Signed)
*  STAT* If patient is at the pharmacy, call can be transferred to refill team.   1. Which medications need to be refilled? (please list name of each medication and dose if known)   apixaban  (ELIQUIS ) 5 MG TABS tablet   2. Would you like to learn more about the convenience, safety, & potential cost savings by using the [facility]?   3. Are you open to using the [facility] (Type [facility]. ).  4. Which pharmacy/location (including street and city if local pharmacy) is medication to be sent to?  [id number] - [place], [place] - [address]   5. Do they need a 30 day or 90 day supply?   90 day  Patient stated she only has enough medication for today.  Patient has appointment scheduled with T. Conte on 8/22.

## 2023-07-11 NOTE — Telephone Encounter (Signed)
 Prescription refill request for Eliquis  received. Indication: AF Last office visit: 10/20/22  Sharrie Deed MD Scr: eGFR 45.00 on 03/28/23  BUN 14 Age: 83 Weight: 84.1kg  Based on above findings Eliquis  5mg  twice daily is the appropriate dose.  Refill approved.

## 2023-07-19 ENCOUNTER — Other Ambulatory Visit

## 2023-07-19 ENCOUNTER — Encounter

## 2023-08-03 ENCOUNTER — Ambulatory Visit: Admission: RE | Admit: 2023-08-03 | Source: Ambulatory Visit

## 2023-08-03 ENCOUNTER — Ambulatory Visit
Admission: RE | Admit: 2023-08-03 | Discharge: 2023-08-03 | Disposition: A | Discharge status: Home / Self Care | Source: Ambulatory Visit | Attending: Internal Medicine | Admitting: Internal Medicine

## 2023-08-03 DIAGNOSIS — N6459 Other signs and symptoms in breast: Secondary | ICD-10-CM | POA: Diagnosis not present

## 2023-08-03 DIAGNOSIS — N61 Mastitis without abscess: Secondary | ICD-10-CM

## 2023-08-04 ENCOUNTER — Other Ambulatory Visit: Payer: Self-pay | Admitting: Cardiovascular Disease

## 2023-08-22 ENCOUNTER — Other Ambulatory Visit: Payer: Self-pay

## 2023-08-22 MED ORDER — DILTIAZEM HCL ER COATED BEADS 240 MG PO CP24: 240.0000 mg | ORAL_CAPSULE | Freq: Every day | ORAL | 0 refills | Status: DC

## 2023-08-23 ENCOUNTER — Other Ambulatory Visit: Payer: Self-pay | Admitting: Cardiovascular Disease

## 2023-09-16 DIAGNOSIS — N1831 Chronic kidney disease, stage 3a: Secondary | ICD-10-CM | POA: Diagnosis not present

## 2023-09-16 DIAGNOSIS — E1169 Type 2 diabetes mellitus with other specified complication: Secondary | ICD-10-CM | POA: Diagnosis not present

## 2023-09-16 DIAGNOSIS — E7800 Pure hypercholesterolemia, unspecified: Secondary | ICD-10-CM | POA: Diagnosis not present

## 2023-09-16 DIAGNOSIS — Z0000 Encounter for general adult medical examination without abnormal findings: Secondary | ICD-10-CM | POA: Diagnosis not present

## 2023-09-16 DIAGNOSIS — I480 Paroxysmal atrial fibrillation: Secondary | ICD-10-CM | POA: Diagnosis not present

## 2023-09-16 DIAGNOSIS — I10 Essential (primary) hypertension: Secondary | ICD-10-CM | POA: Diagnosis not present

## 2023-09-16 DIAGNOSIS — D6869 Other thrombophilia: Secondary | ICD-10-CM | POA: Diagnosis not present

## 2023-09-16 DIAGNOSIS — E1122 Type 2 diabetes mellitus with diabetic chronic kidney disease: Secondary | ICD-10-CM | POA: Diagnosis not present

## 2023-09-21 ENCOUNTER — Ambulatory Visit: Admitting: Podiatry

## 2023-09-21 ENCOUNTER — Encounter: Payer: Self-pay | Admitting: Podiatry

## 2023-09-21 DIAGNOSIS — M79674 Pain in right toe(s): Secondary | ICD-10-CM | POA: Diagnosis not present

## 2023-09-21 DIAGNOSIS — E1151 Type 2 diabetes mellitus with diabetic peripheral angiopathy without gangrene: Secondary | ICD-10-CM | POA: Diagnosis not present

## 2023-09-21 DIAGNOSIS — B351 Tinea unguium: Secondary | ICD-10-CM

## 2023-09-21 DIAGNOSIS — M79675 Pain in left toe(s): Secondary | ICD-10-CM

## 2023-09-21 DIAGNOSIS — Q828 Other specified congenital malformations of skin: Secondary | ICD-10-CM | POA: Diagnosis not present

## 2023-09-25 NOTE — Progress Notes (Signed)
  Subjective:  Patient ID: Jessica Baxter, female    DOB: Apr 15, 1940,  MRN: [id number]  Jessica Baxter presents to clinic today for at risk foot care. Pt has h/o NIDDM with PAD and painful porokeratotic lesion(s) right foot and painful mycotic toenails that limit ambulation. Painful toenails interfere with ambulation. Aggravating factors include wearing enclosed shoe gear. Pain is relieved with periodic professional debridement. Painful porokeratotic lesions are aggravated when weightbearing with and without shoegear. Pain is relieved with periodic professional debridement.  Chief Complaint  Patient presents with   RFC    RFC Non diabetic toenail trim. LOV with PCP 08/2023.   New problem(s): None.   PCP is Jessica Ingle, MD.  No Known Allergies  Review of Systems: Negative except as noted in the HPI.  Objective: No changes noted in today's physical examination. There were no vitals filed for this visit. Jessica Baxter is a pleasant 83 y.o. female in NAD. AAO x 3.  Vascular Examination: Capillary refill time immediate b/l. Vascular status intact b/l with palpable DP pulses; faintly palpable PT pulses. Pedal hair absent b/l.No pain with calf compression b/l. Skin temperature gradient WNL b/l. No cyanosis or clubbing noted b/l. No ischemia or gangrene b/l. +1 pitting edema noted BLE.  Neurological Examination: Sensation grossly intact b/l with 10 gram monofilament. Vibratory sensation intact b/l.   Dermatological Examination: Pedal skin with normal turgor, texture and tone b/l.  No open wounds. No interdigital macerations.   Toenails 1-5 b/l thick, discolored, elongated with subungual debris and pain on dorsal palpation.   Porokeratotic lesion(s) submet head 3 right foot. No erythema, no edema, no drainage, no fluctuance.  Musculoskeletal Examination: Muscle strength 5/5 to all lower extremity muscle groups bilaterally. Hammertoe(s) L 4th toe, L 5th toe, R 4th toe, and R 5th  toe.  Assessment/Plan: 1. Pain due to onychomycosis of toenails of both feet   2. Porokeratosis   3. Type II diabetes mellitus with peripheral circulatory disorder [facility])    Consent given for treatment. Patient examined. All patient's and/or POA's questions/concerns addressed on today's visit. Toenails 1-5 debrided in length and girth without incident. Porokeratotic lesion(s) submet head 3 right foot pared and enucleated with sharp debridement without incident. Continue foot and shoe inspections daily. Monitor blood glucose per PCP/Endocrinologist's recommendations.Continue soft, supportive shoe gear daily. Report any pedal injuries to medical professional. Call office if there are any questions/concerns. -Patient/POA to call should there be question/concern in the interim.   Return in about 3 months (around 12/22/2023).  Jessica Baxter, DPM      [place] LOCATION: [address] [place] [address]                   Office [phone]   [facility] LOCATION: [address] [place], [place] [address] Office [phone]

## 2023-10-21 ENCOUNTER — Ambulatory Visit: Attending: Physician Assistant | Admitting: Physician Assistant

## 2023-10-21 NOTE — Progress Notes (Deleted)
  Cardiology Office Note   Date:  10/21/2023  ID:  Jessica Baxter, DOB [birth year], MRN [id number] PCP: Rexanne Ingle, MD  [facility] Providers Cardiologist:  Maude Emmer, MD   History of Present Illness Jessica Baxter is a 83 y.o. female with a past medical history of chronic atrial fibrillation, HTN, DM, HLD, intolerance of beta-blocker due to fatigue, rate control/anticoagulation strategy adopted for her atrial fibrillation here for follow-up appointment.  History includes 2D echocardiogram March 2021 with normal LVEF, mild MR, LA 46 mm.  Generally unaware of her atrial fibrillation.  No chest pain, palpitations, SOB, syncope or bleeding.  Retired and more sedentary.  Weight is up and has increased edema in her lower extremities.  At her last visit 10/20/2022 was no diabetes mellitus.  She did not wear compression stockings, leg edema does not improve with elevation, and she also can use about Lasix  making her urinate all the time.  Was seen in the ER March 2021 with left leg weakness.  Subacute left stomach Lackner with an infarct was found.  Follow-up MRI with no acute abnormality.  05/26/2022 lymphedema was worse, BP and HR was elevated.  Decided to increase her Lasix  up to 20 mg twice daily and added low-dose beta-blocker.  She was focused on a spot on her breast but had benign mammogram in April.  Today, she***  ROS: Pertinent ROS in HPI  Studies Reviewed      Echo 05/15/19  IMPRESSIONS     1. Left ventricular ejection fraction, by estimation, is 65 to 70%. The  left ventricle has normal function. The left ventricle has no regional  wall motion abnormalities. Left ventricular diastolic parameters are  indeterminate.   2. Right ventricular systolic function is normal. The right ventricular  size is normal. There is moderately elevated pulmonary artery systolic  pressure.   3. Left atrial size was mildly dilated.   4. The mitral valve is normal in structure.  Mild mitral valve  regurgitation. No evidence of mitral stenosis.   5. The aortic valve is normal in structure. Aortic valve regurgitation is  not visualized. No aortic stenosis is present.   6. The inferior vena cava is normal in size with greater than 50%  respiratory variability, suggesting right atrial pressure of 3 mmHg.   Risk Assessment/Calculations {Does this patient have ATRIAL FIBRILLATION?:[id number]} No BP recorded.  {Refresh Note OR Click here to enter BP  :1}***       Physical Exam VS:  There were no vitals taken for this visit.       Wt Readings from Last 3 Encounters:  06/08/23 185 lb 6.4 oz (84.1 kg)  01/19/23 185 lb 6.4 oz (84.1 kg)  10/20/22 185 lb 6.4 oz (84.1 kg)    GEN: Well nourished, well developed in no acute distress NECK: No JVD; No carotid bruits CARDIAC: ***RRR, no murmurs, rubs, gallops RESPIRATORY:  Clear to auscultation without rales, wheezing or rhonchi  ABDOMEN: Soft, non-tender, non-distended EXTREMITIES:  No edema; No deformity   ASSESSMENT AND PLAN Chronic atrial fibrillation, rate controlled HLD Obesity DM type II Edema    {Are you ordering a CV Procedure (e.g. stress test, cath, DCCV, TEE, etc)?   Press F2        :[id number]}  Dispo: ***  Signed, Orren LOISE Fabry, PA-C

## 2024-01-01 ENCOUNTER — Other Ambulatory Visit: Payer: Self-pay | Admitting: Cardiovascular Disease

## 2024-01-04 ENCOUNTER — Encounter: Payer: Self-pay | Admitting: Podiatry

## 2024-01-04 ENCOUNTER — Ambulatory Visit: Admitting: Podiatry

## 2024-01-04 DIAGNOSIS — M79674 Pain in right toe(s): Secondary | ICD-10-CM | POA: Diagnosis not present

## 2024-01-04 DIAGNOSIS — M79675 Pain in left toe(s): Secondary | ICD-10-CM

## 2024-01-04 DIAGNOSIS — E1151 Type 2 diabetes mellitus with diabetic peripheral angiopathy without gangrene: Secondary | ICD-10-CM

## 2024-01-04 DIAGNOSIS — Q828 Other specified congenital malformations of skin: Secondary | ICD-10-CM

## 2024-01-04 DIAGNOSIS — B351 Tinea unguium: Secondary | ICD-10-CM

## 2024-01-12 ENCOUNTER — Encounter: Payer: Self-pay | Admitting: Podiatry

## 2024-01-12 NOTE — Progress Notes (Signed)
  Subjective:  Patient ID: Jessica Baxter, female    DOB: 01-Aug-1940,  MRN: [id number]  Jessica Baxter presents to clinic today for at risk foot care. Pt has h/o NIDDM with PAD and painful porokeratotic lesion(s) right lower extremity and painful mycotic toenails that limit ambulation. Painful toenails interfere with ambulation. Aggravating factors include wearing enclosed shoe gear. Pain is relieved with periodic professional debridement. Painful porokeratotic lesions are aggravated when weightbearing with and without shoegear. Pain is relieved with periodic professional debridement.  Chief Complaint  Patient presents with   RFC    RFC Non diabetic toenail rim. LOV with PCP 09/16/23.   New problem(s): None.   PCP is Rexanne Ingle, MD.  No Known Allergies  Review of Systems: Negative except as noted in the HPI.  Objective: No changes noted in today's physical examination. There were no vitals filed for this visit. Jessica Baxter is a pleasant 83 y.o. female in NAD. AAO x 3.  Vascular Examination: Capillary refill time immediate b/l. Vascular status intact b/l with palpable DP pulses; faintly palpable PT pulses. Pedal hair absent b/l.No pain with calf compression b/l. Skin temperature gradient WNL b/l. No cyanosis or clubbing noted b/l. No ischemia or gangrene b/l. +1 pitting edema noted BLE.  Neurological Examination: Sensation grossly intact b/l with 10 gram monofilament. Vibratory sensation intact b/l.   Dermatological Examination: Pedal skin with normal turgor, texture and tone b/l.  No open wounds. No interdigital macerations.   Toenails 1-5 b/l thick, discolored, elongated with subungual debris and pain on dorsal palpation.   Porokeratotic lesion(s) submet head 3 right foot. No erythema, no edema, no drainage, no fluctuance.  Musculoskeletal Examination: Muscle strength 5/5 to all lower extremity muscle groups bilaterally. Hammertoe(s) L 4th toe, L 5th toe, R 4th  toe, and R 5th toe.  Assessment/Plan: 1. Pain due to onychomycosis of toenails of both feet   2. Porokeratosis   3. Type II diabetes mellitus with peripheral circulatory disorder [facility])   Patient was evaluated and treated. All patient's and/or POA's questions/concerns addressed on today's visit. Mycotic toenails 1-5 b/l debrided in length and girth without incident. Porokeratotic lesion(s) submet head 3 right foot pared with sharp debridement without incident. Continue daily foot inspections and monitor blood glucose per PCP/Endocrinologist's recommendations. Continue soft, supportive shoe gear daily. Report any pedal injuries to medical professional. Call office if there are any questions/concerns.  Return in about 3 months (around 04/05/2024).  Delon LITTIE Merlin, DPM       LOCATION: [address], [place] [address]                   Office [phone]   [facility] LOCATION: [address] [place], [place] [address] Office [phone]

## 2024-02-04 ENCOUNTER — Other Ambulatory Visit: Payer: Self-pay | Admitting: Cardiovascular Disease

## 2024-02-25 ENCOUNTER — Other Ambulatory Visit: Payer: Self-pay | Admitting: Cardiovascular Disease

## 2024-02-27 NOTE — Progress Notes (Unsigned)
 "   Cardiology Office Note Date:  02/28/2024  Patient ID:  Jessica Baxter, Jessica Baxter November 29, 1940, MRN [id number] PCP:  Rexanne Ingle, MD  Cardiologist: Dr. Delford   Chief Complaint:  F/u afib   History of Present Illness: Jessica Baxter is a 83 y.o. female with history of chronic atrial fib, HTN, DM (diet-controlled), HLD, intolerance of BB due to fatigue  Rate control/anticoagulation strategy adopted   2D echo 05/15/19 Normal EF mild MR LA 46 mm   She is generally unaware of her atrial fibrillation. No chest pain, palpitations, SOB, syncope or bleeding   She is retired and more sedentary Weight is up and has increased edema  Discussed low sodium diet, she will not wear compression stockings does not improvement with leg elevation and complains about lasix  making her urinate all the time   Seen in ER 05/08/19 left leg weakness ? Arthritis CT ? Subacute left thalamic lacunar bland infarct But F/u MRI with no acute abnormality   05/26/22 Lymphedema worse, BP and HR up Increased llasix to 20 mg bid and added low dose beta blocker   She was focused on a spot on her breast but had benign mammogram in April   Did not take her BP meds today forgot. Wearing her compression stockings Diet is poor with fast food   Past Medical History:  Diagnosis Date   A-fib (HCC)    Chronic atrial fibrillation (HCC)    Diabetes mellitus (HCC)    Essential hypertension    Hypercholesteremia    Obesity    Osteopenia     Past Surgical History:  Procedure Laterality Date   PARTIAL HYSTERECTOMY      Current Outpatient Medications  Medication Sig Dispense Refill   albuterol  (PROVENTIL  HFA;VENTOLIN  HFA) 108 (90 Base) MCG/ACT inhaler Inhale 2 puffs into the lungs every 4 (four) hours as needed for wheezing or shortness of breath (cough, shortness of breath or wheezing.). 1 Inhaler 1   ALPRAZolam (XANAX) 0.25 MG tablet Take 0.25 mg by mouth daily as needed for anxiety.   3   apixaban  (ELIQUIS ) 5 MG TABS  tablet TAKE 1 TABLET BY MOUTH 2 TIMES DAILY. NEEDS LABS FOR ELIQUIS  REFILLS, COME TO OFFICE 60 tablet 5   benzonatate  (TESSALON ) 100 MG capsule Take 1 capsule (100 mg total) by mouth every 8 (eight) hours. 21 capsule 0   clotrimazole-betamethasone (LOTRISONE) cream as needed.     diltiazem  (CARDIZEM  CD) 240 MG 24 hr capsule Take 1 capsule (240 mg total) by mouth daily. 30 capsule 0   ezetimibe (ZETIA) 10 MG tablet Take 10 mg by mouth daily.     furosemide  (LASIX ) 20 MG tablet TAKE 1 TABLET BY MOUTH TWICE A DAY 180 tablet 0   losartan (COZAAR) 100 MG tablet Take 100 mg by mouth daily.     meclizine (ANTIVERT) 25 MG tablet Take 25 mg by mouth every 8 (eight) hours as needed.     metoprolol  succinate (TOPROL -XL) 25 MG 24 hr tablet TAKE 1 TABLET (25 MG TOTAL) BY MOUTH DAILY. 90 tablet 1   MITIGARE 0.6 MG CAPS prn     Omega-3 Fatty Acids (FISH OIL PO) Take by mouth as directed.      potassium chloride  SA (KLOR-CON  M20) 20 MEQ tablet Take 1 tablet (20 mEq total) by mouth daily. 90 tablet 3   predniSONE (DELTASONE) 10 MG tablet Take 10 mg by mouth daily at 2 PM. (Patient not taking: Reported on 01/04/2024)     Red  Yeast Rice Extract (RED YEAST RICE PO) Take 1 tablet by mouth daily.     No current facility-administered medications for this visit.    Allergies:   Patient has no known allergies.   Social History:  The patient  reports that she has never smoked. She has never used smokeless tobacco. She reports that she does not drink alcohol and does not use drugs.   Family History:  The patient's family history includes Hypertension in her mother; Stroke in her brother.  ROS:  Please see the history of present illness. Trace ankle edema when swinging or hanging legs that resolves when elevating legs. All other systems are reviewed and otherwise negative.   PHYSICAL EXAM:  VS:  There were no vitals taken for this visit. BMI: There is no height or weight on file to calculate BMI.    Affect  appropriate Healthy:  appears stated age HEENT: normal Neck supple with no adenopathy JVP normal no bruits no thyromegaly Lungs clear with no wheezing and good diaphragmatic motion Heart:  S1/S2 no murmur, no rub, gallop or click PMI normal Abdomen: benighn, BS positve, no tenderness, no AAA no bruit.  No HSM or HJR Distal pulses intact with no bruits Plus 2 LE edema some appears to be lymph edema  Neuro non-focal Skin warm and dry No muscular weakness   EKG:  02/28/2024 afib rate 106  otherwise normal    CrCl cannot be calculated (Patient's most recent lab result is older than the maximum 21 days allowed.).   Wt Readings from Last 3 Encounters:  06/08/23 185 lb 6.4 oz (84.1 kg)  01/19/23 185 lb 6.4 oz (84.1 kg)  10/20/22 185 lb 6.4 oz (84.1 kg)     Other studies reviewed: Echo 05/15/19  IMPRESSIONS     1. Left ventricular ejection fraction, by estimation, is 65 to 70%. The  left ventricle has normal function. The left ventricle has no regional  wall motion abnormalities. Left ventricular diastolic parameters are  indeterminate.   2. Right ventricular systolic function is normal. The right ventricular  size is normal. There is moderately elevated pulmonary artery systolic  pressure.   3. Left atrial size was mildly dilated.   4. The mitral valve is normal in structure. Mild mitral valve  regurgitation. No evidence of mitral stenosis.   5. The aortic valve is normal in structure. Aortic valve regurgitation is  not visualized. No aortic stenosis is present.   6. The inferior vena cava is normal in size with greater than 50%  respiratory variability, suggesting right atrial pressure of 3 mmHg.    ASSESSMENT AND PLAN:  Chronic atrial fib - did not tolerated cardizem  at 360 mg dose back to 240 mg Rate up add Toprol  25 mg daily 09/2022  EF normal just mild MR on TTE 05/15/19 will update  HLD - lipids are being followed by PCP.Using red yeast rice  Obesity - not going to  gym or watching her diet  DM-2  Diet controlled continue portion control and low carbs A1c with primary  Edema: better on bid lasix   BP:  high today but did not take meds Discussed compliance and better diet    Disposition: F/u with me in a year  Tests:  None Changes:  None    Current medicines are reviewed at length with the patient today.  The patient did not have any concerns regarding medicines.  Maude Emmer  "

## 2024-02-28 ENCOUNTER — Ambulatory Visit: Attending: Cardiovascular Disease | Admitting: Cardiovascular Disease

## 2024-02-28 ENCOUNTER — Other Ambulatory Visit: Payer: Self-pay

## 2024-02-28 VITALS — BP 168/64 | HR 70 | Ht 62.5 in | Wt 202.0 lb

## 2024-02-28 DIAGNOSIS — I482 Chronic atrial fibrillation, unspecified: Secondary | ICD-10-CM | POA: Diagnosis not present

## 2024-02-28 DIAGNOSIS — I10 Essential (primary) hypertension: Secondary | ICD-10-CM

## 2024-02-28 MED ORDER — METOPROLOL SUCCINATE ER 25 MG PO TB24: 25.0000 mg | ORAL_TABLET | Freq: Every day | ORAL | 3 refills | Status: AC

## 2024-02-28 NOTE — Patient Instructions (Signed)
 Medication Instructions:  No medication changes were made at this visit. Continue current regimen.   *If you need a refill on your cardiac medications before your next appointment, please call your pharmacy*  Lab Work: None ordered today. If you have labs (blood work) drawn today and your tests are completely normal, you will receive your results only by: MyChart Message (if you have MyChart) OR A paper copy in the mail If you have any lab test that is abnormal or we need to change your treatment, we will call you to review the results.  Testing/Procedures: None ordered today.  Follow-Up: At [facility] you and your health needs are our priority.  As part of our continuing mission to provide you with exceptional heart care, our providers are all part of one team.  This team includes your primary Cardiologist (physician) and Advanced Practice Providers or APPs (Physician Assistants and Nurse Practitioners) who all work together to provide you with the care you need, when you need it.  Your next appointment:   1 year(s)  Provider:   Maude Emmer, MD

## 2024-03-27 ENCOUNTER — Other Ambulatory Visit: Payer: Self-pay | Admitting: Cardiovascular Disease

## 2024-04-24 ENCOUNTER — Ambulatory Visit: Admitting: Podiatry
